# Patient Record
Sex: Female | Born: 1960
Health system: Southern US, Community
[De-identification: ages and names within clinical notes are randomized; demographics above are authoritative.]

## PROBLEM LIST (undated history)

## (undated) DIAGNOSIS — I10 Essential (primary) hypertension: Secondary | ICD-10-CM

## (undated) DIAGNOSIS — N179 Acute kidney failure, unspecified: Secondary | ICD-10-CM

## (undated) HISTORY — DX: Essential (primary) hypertension: I10

---

## 2018-04-22 ENCOUNTER — Telehealth: Payer: Self-pay | Admitting: Hematology and Oncology

## 2018-04-22 NOTE — Telephone Encounter (Signed)
New referral received from Dr. Theda Sers at [facility]. Pt has been scheduled to see Dr. Lindi Adie on 8/6 at 345pm. Pt aware to arrive 30 minutes early. Letter mailed to the.

## 2018-04-29 ENCOUNTER — Telehealth: Payer: Self-pay | Admitting: Hematology and Oncology

## 2018-04-29 ENCOUNTER — Inpatient Hospital Stay: Payer: Managed Care, Other (non HMO) | Attending: Hematology and Oncology | Admitting: Hematology and Oncology

## 2018-04-29 VITALS — BP 141/86 | HR 99 | Temp 98.4°F | Resp 18 | Ht 62.5 in | Wt 121.2 lb

## 2018-04-29 DIAGNOSIS — D7289 Other specified disorders of white blood cells: Secondary | ICD-10-CM | POA: Diagnosis not present

## 2018-04-29 DIAGNOSIS — D72829 Elevated white blood cell count, unspecified: Secondary | ICD-10-CM | POA: Insufficient documentation

## 2018-04-29 DIAGNOSIS — F1020 Alcohol dependence, uncomplicated: Secondary | ICD-10-CM

## 2018-04-29 DIAGNOSIS — D729 Disorder of white blood cells, unspecified: Secondary | ICD-10-CM

## 2018-04-29 DIAGNOSIS — K7010 Alcoholic hepatitis without ascites: Secondary | ICD-10-CM | POA: Diagnosis not present

## 2018-04-29 DIAGNOSIS — D7589 Other specified diseases of blood and blood-forming organs: Secondary | ICD-10-CM | POA: Diagnosis not present

## 2018-04-29 NOTE — Telephone Encounter (Signed)
Appts scheduled AVS/Calendar printed per 8/6 los

## 2018-04-29 NOTE — Progress Notes (Signed)
Patient Care Team: Janie Morning, DO as PCP - General (Family Medicine)  DIAGNOSIS:  Encounter Diagnosis  Name Primary?  . Neutrophilia Yes    CHIEF COMPLIANT: Elevated neutrophil count and elevated GGT  INTERVAL HISTORY: Melissa Kent is a 57 year old with above-mentioned history of elevated WBC count particularly elevated neutrophil count.  She was previously at [facility] well and was never informed that she had elevated white count.  She has had major problems with alcohol abuse and she also complains of pain in the shoulder and the back.  She has problems with sleeping and he was asleep medication.  She takes gabapentin for neuropathic pain in the legs.  REVIEW OF SYSTEMS:   Constitutional: Denies fevers, chills or abnormal weight loss Eyes: Denies blurriness of vision Ears, nose, mouth, throat, and face: Denies mucositis or sore throat Respiratory: Denies cough, dyspnea or wheezes Cardiovascular: Denies palpitation, chest discomfort Gastrointestinal:  Denies nausea, heartburn or change in bowel habits Skin: Denies abnormal skin rashes Lymphatics: Denies new lymphadenopathy or easy bruising Neurological: Peripheral neuropathy Behavioral/Psych: Mood is stable, no new changes  Extremities: No lower extremity edema All other systems were reviewed with the patient and are negative.  I have reviewed the past medical history, past surgical history, social history and family history with the patient and they are unchanged from previous note.  ALLERGIES:  has no allergies on file.  MEDICATIONS:  No current outpatient medications on file.   No current facility-administered medications for this visit.     PHYSICAL EXAMINATION: ECOG PERFORMANCE STATUS: 1 - Symptomatic but completely ambulatory  Vitals:   04/29/18 1543  BP: (!) 141/86  Pulse: 99  Resp: 18  Temp: 98.4 F (36.9 C)  SpO2: 99%   Filed Weights   04/29/18 1543  Weight: 121 lb 3.2 oz (55 kg)    GENERAL:alert,  no distress and comfortable SKIN: skin color, texture, turgor are normal, no rashes or significant lesions EYES: normal, Conjunctiva are pink and non-injected, sclera clear OROPHARYNX:no exudate, no erythema and lips, buccal mucosa, and tongue normal  NECK: supple, thyroid normal size, non-tender, without nodularity LYMPH:  no palpable lymphadenopathy in the cervical, axillary or inguinal LUNGS: clear to auscultation and percussion with normal breathing effort HEART: regular rate & rhythm and no murmurs and no lower extremity edema ABDOMEN:abdomen soft, non-tender and normal bowel sounds MUSCULOSKELETAL:no cyanosis of digits and no clubbing  NEURO: alert & oriented x 3 with fluent speech, peripheral neuropathy EXTREMITIES: No lower extremity edema   LABORATORY DATA:  I have reviewed the data as listed No flowsheet data found.  No results found for: WBC, HGB, HCT, MCV, PLT, NEUTROABS  ASSESSMENT & PLAN:  Neutrophilia Elevation of neutrophils is the primary reason for the elevation of WBC count. I discussed with the patient the differential diagnosis is primarily between infection versus inflammation. Patient has extensive inflammatory areas including the painful clavicle as well as hepatitis from alcoholism.  I also explained to the patient that the elevation of GGT is directly related to alcoholism.  Her macrocytosis with an MCV of 115 is also directly related to alcoholism. If she does not quit drinking then it is very highly likely that it could lead to bone marrow damage and myelodysplasia as well as liver damage and liver cirrhosis.  I also discussed with her about taking supplements including G86, folic acid, thiamine, B6. There is no additional testing that needs to be done from hematology oncology standpoint. We do not need to perform a bone  marrow biopsy at this time.  I would like to see her back in 1 year and if her blood counts improve then we do not have to see her again  in the future.    Orders Placed This Encounter  Procedures  . CBC with Differential (Cancer Center Only)    Standing Status:   Future    Standing Expiration Date:   04/30/2019  . CMP ([facility] only)    Standing Status:   Future    Standing Expiration Date:   04/30/2019   The patient has a good understanding of the overall plan. she agrees with it. she will call with any problems that may develop before the next visit here.   Harriette Ohara, MD 04/29/18

## 2018-04-29 NOTE — Assessment & Plan Note (Signed)
Elevation of neutrophils is the primary reason for the elevation of WBC count. I discussed with the patient the differential diagnosis is primarily between infection versus inflammation. Patient has extensive inflammatory areas including the painful clavicle as well as hepatitis from alcoholism.  I also explained to the patient that the elevation of GGT is directly related to alcoholism.  Her macrocytosis with an MCV of 115 is also directly related to alcoholism. If she does not quit drinking then it is very highly likely that it could lead to bone marrow damage and myelodysplasia as well as liver damage and liver cirrhosis.  I also discussed with her about taking supplements including S13, folic acid, thiamine, B6. There is no additional testing that needs to be done from hematology oncology standpoint. We do not need to perform a bone marrow biopsy at this time.  I would like to see her back in 1 year and if her blood counts improve then we do not have to see her again in the future.

## 2019-04-23 NOTE — Assessment & Plan Note (Deleted)
Neutrophilia: Inflammation related.  Patient has extensive inflammation areas including painful clavicle as well as hepatitis from alcoholism.  Macrocytosis and elevation of GGT secondary to alcoholism as well.  Lab review: 04/30/2019:

## 2019-04-30 ENCOUNTER — Inpatient Hospital Stay: Payer: Self-pay | Attending: Hematology and Oncology

## 2019-04-30 ENCOUNTER — Inpatient Hospital Stay: Payer: Self-pay | Admitting: Hematology and Oncology

## 2020-02-12 DIAGNOSIS — R609 Edema, unspecified: Secondary | ICD-10-CM | POA: Diagnosis not present

## 2020-02-16 DIAGNOSIS — M79671 Pain in right foot: Secondary | ICD-10-CM | POA: Diagnosis not present

## 2020-02-16 DIAGNOSIS — K469 Unspecified abdominal hernia without obstruction or gangrene: Secondary | ICD-10-CM | POA: Diagnosis not present

## 2020-02-16 DIAGNOSIS — R140 Abdominal distension (gaseous): Secondary | ICD-10-CM | POA: Diagnosis not present

## 2020-02-16 DIAGNOSIS — R609 Edema, unspecified: Secondary | ICD-10-CM | POA: Diagnosis not present

## 2020-02-19 DIAGNOSIS — Z79899 Other long term (current) drug therapy: Secondary | ICD-10-CM | POA: Diagnosis not present

## 2020-03-04 ENCOUNTER — Other Ambulatory Visit: Payer: Self-pay | Admitting: Surgery

## 2020-03-04 DIAGNOSIS — K429 Umbilical hernia without obstruction or gangrene: Secondary | ICD-10-CM | POA: Diagnosis not present

## 2020-03-04 DIAGNOSIS — R188 Other ascites: Secondary | ICD-10-CM | POA: Diagnosis not present

## 2020-03-04 DIAGNOSIS — Z720 Tobacco use: Secondary | ICD-10-CM | POA: Diagnosis not present

## 2020-03-07 ENCOUNTER — Other Ambulatory Visit: Payer: Self-pay | Admitting: Surgery

## 2020-03-07 DIAGNOSIS — K429 Umbilical hernia without obstruction or gangrene: Secondary | ICD-10-CM

## 2020-03-19 ENCOUNTER — Other Ambulatory Visit: Payer: Self-pay

## 2020-03-19 ENCOUNTER — Emergency Department (HOSPITAL_COMMUNITY)
Admission: EM | Admit: 2020-03-19 | Discharge: 2020-03-20 | Disposition: A | Discharge status: Home / Self Care | Payer: BC Managed Care – PPO | Attending: Emergency Medicine | Admitting: Emergency Medicine

## 2020-03-19 DIAGNOSIS — R197 Diarrhea, unspecified: Secondary | ICD-10-CM | POA: Diagnosis not present

## 2020-03-19 DIAGNOSIS — R000 Tachycardia, unspecified: Secondary | ICD-10-CM | POA: Diagnosis not present

## 2020-03-19 DIAGNOSIS — R932 Abnormal findings on diagnostic imaging of liver and biliary tract: Secondary | ICD-10-CM | POA: Insufficient documentation

## 2020-03-19 DIAGNOSIS — Z79899 Other long term (current) drug therapy: Secondary | ICD-10-CM | POA: Diagnosis not present

## 2020-03-19 DIAGNOSIS — N3090 Cystitis, unspecified without hematuria: Secondary | ICD-10-CM | POA: Insufficient documentation

## 2020-03-19 DIAGNOSIS — N3000 Acute cystitis without hematuria: Secondary | ICD-10-CM | POA: Diagnosis not present

## 2020-03-19 DIAGNOSIS — F17210 Nicotine dependence, cigarettes, uncomplicated: Secondary | ICD-10-CM | POA: Insufficient documentation

## 2020-03-19 DIAGNOSIS — I10 Essential (primary) hypertension: Secondary | ICD-10-CM | POA: Diagnosis not present

## 2020-03-19 DIAGNOSIS — R160 Hepatomegaly, not elsewhere classified: Secondary | ICD-10-CM

## 2020-03-19 DIAGNOSIS — R1084 Generalized abdominal pain: Secondary | ICD-10-CM | POA: Insufficient documentation

## 2020-03-19 DIAGNOSIS — R112 Nausea with vomiting, unspecified: Secondary | ICD-10-CM | POA: Diagnosis not present

## 2020-03-19 DIAGNOSIS — R1110 Vomiting, unspecified: Secondary | ICD-10-CM | POA: Diagnosis not present

## 2020-03-19 LAB — URINALYSIS, ROUTINE W REFLEX MICROSCOPIC
Bilirubin Urine: NEGATIVE
Glucose, UA: NEGATIVE mg/dL
Ketones, ur: 5 mg/dL — AB
Nitrite: NEGATIVE
Protein, ur: 100 mg/dL — AB
Specific Gravity, Urine: 1.014 (ref 1.005–1.030)
WBC, UA: >50 WBC/hpf — ABNORMAL HIGH (ref 0–5)
pH: 5 (ref 5.0–8.0)

## 2020-03-19 LAB — COMPREHENSIVE METABOLIC PANEL WITH GFR
ALT: 13 U/L (ref 0–44)
AST: 32 U/L (ref 15–41)
Albumin: 2.8 g/dL — ABNORMAL LOW (ref 3.5–5.0)
Alkaline Phosphatase: 189 U/L — ABNORMAL HIGH (ref 38–126)
Anion gap: 18 — ABNORMAL HIGH (ref 5–15)
BUN: 7 mg/dL (ref 6–20)
CO2: 16 mmol/L — ABNORMAL LOW (ref 22–32)
Calcium: 8 mg/dL — ABNORMAL LOW (ref 8.9–10.3)
Chloride: 93 mmol/L — ABNORMAL LOW (ref 98–111)
Creatinine, Ser: 1 mg/dL (ref 0.44–1.00)
GFR calc Af Amer: >60 mL/min
GFR calc non Af Amer: >60 mL/min
Glucose, Bld: 100 mg/dL — ABNORMAL HIGH (ref 70–99)
Potassium: 3.5 mmol/L (ref 3.5–5.1)
Sodium: 127 mmol/L — ABNORMAL LOW (ref 135–145)
Total Bilirubin: 2.5 mg/dL — ABNORMAL HIGH (ref 0.3–1.2)
Total Protein: 7.5 g/dL (ref 6.5–8.1)

## 2020-03-19 LAB — CBC
HCT: 24.1 % — ABNORMAL LOW (ref 36.0–46.0)
Hemoglobin: 8.1 g/dL — ABNORMAL LOW (ref 12.0–15.0)
MCH: 30 pg (ref 26.0–34.0)
MCHC: 33.6 g/dL (ref 30.0–36.0)
MCV: 89.3 fL (ref 80.0–100.0)
Platelets: 550 K/uL — ABNORMAL HIGH (ref 150–400)
RBC: 2.7 MIL/uL — ABNORMAL LOW (ref 3.87–5.11)
RDW: 14.8 % (ref 11.5–15.5)
WBC: 22.5 K/uL — ABNORMAL HIGH (ref 4.0–10.5)
nRBC: 0 % (ref 0.0–0.2)

## 2020-03-19 LAB — LIPASE, BLOOD: Lipase: 44 U/L (ref 11–51)

## 2020-03-19 MED ORDER — SODIUM CHLORIDE 0.9% FLUSH: 3.0000 mL | Freq: Once | INTRAVENOUS | Status: DC

## 2020-03-19 MED ORDER — ONDANSETRON 4 MG PO TBDP
4.0000 mg | ORAL_TABLET | Freq: Once | ORAL | Status: AC | PRN
  Administered 2020-03-19: 4 mg via ORAL
  Filled 2020-03-19: qty 1

## 2020-03-19 NOTE — ED Triage Notes (Signed)
Pt c/o nausea/vomiting/diarrhea x 2 weeks. Also c/o dizziness and feeling "disoriented" at time. A&O x 4 at this time, no neuro deficits noted.

## 2020-03-20 ENCOUNTER — Emergency Department (HOSPITAL_COMMUNITY): Payer: BC Managed Care – PPO

## 2020-03-20 ENCOUNTER — Encounter (HOSPITAL_COMMUNITY): Payer: Self-pay | Admitting: Emergency Medicine

## 2020-03-20 DIAGNOSIS — R1110 Vomiting, unspecified: Secondary | ICD-10-CM | POA: Diagnosis not present

## 2020-03-20 LAB — LACTIC ACID, PLASMA: Lactic Acid, Venous: 0.9 mmol/L (ref 0.5–1.9)

## 2020-03-20 MED ORDER — ONDANSETRON 4 MG PO TBDP
4.0000 mg | ORAL_TABLET | Freq: Three times a day (TID) | ORAL | 0 refills | Status: AC | PRN
Start: 2020-03-20 — End: ?

## 2020-03-20 MED ORDER — OXYCODONE HCL 5 MG PO TABS: 5.0000 mg | ORAL_TABLET | Freq: Four times a day (QID) | ORAL | 0 refills | Status: AC | PRN

## 2020-03-20 MED ORDER — CEPHALEXIN 500 MG PO CAPS
500.0000 mg | ORAL_CAPSULE | Freq: Three times a day (TID) | ORAL | 0 refills | Status: AC
Start: 2020-03-20 — End: ?

## 2020-03-20 MED ORDER — OXYCODONE HCL 5 MG PO CAPS: 5.0000 mg | ORAL_CAPSULE | Freq: Four times a day (QID) | ORAL | 0 refills | Status: DC | PRN

## 2020-03-20 MED ORDER — ONDANSETRON HCL 4 MG/2ML IJ SOLN
4.0000 mg | Freq: Once | INTRAMUSCULAR | Status: AC
  Administered 2020-03-20: 4 mg via INTRAVENOUS
  Filled 2020-03-20: qty 2

## 2020-03-20 MED ORDER — FENTANYL CITRATE (PF) 100 MCG/2ML IJ SOLN
100.0000 ug | Freq: Once | INTRAMUSCULAR | Status: AC
  Administered 2020-03-20: 100 ug via INTRAVENOUS
  Filled 2020-03-20: qty 2

## 2020-03-20 MED ORDER — SODIUM CHLORIDE 0.9 % IV SOLN
1.0000 g | Freq: Once | INTRAVENOUS | Status: AC
  Administered 2020-03-20: 1 g via INTRAVENOUS
  Filled 2020-03-20: qty 10

## 2020-03-20 MED ORDER — HYDROMORPHONE HCL 1 MG/ML IJ SOLN
0.5000 mg | Freq: Once | INTRAMUSCULAR | Status: AC
  Administered 2020-03-20: 0.5 mg via INTRAVENOUS
  Filled 2020-03-20: qty 1

## 2020-03-20 MED ORDER — OXYCODONE HCL 5 MG PO TABS
5.0000 mg | ORAL_TABLET | Freq: Once | ORAL | Status: AC
  Administered 2020-03-20: 5 mg via ORAL
  Filled 2020-03-20: qty 1

## 2020-03-20 MED ORDER — SODIUM CHLORIDE 0.9 % IV BOLUS
1000.0000 mL | Freq: Once | INTRAVENOUS | Status: AC
  Administered 2020-03-20: 1000 mL via INTRAVENOUS

## 2020-03-20 MED ORDER — IOHEXOL 300 MG/ML  SOLN
100.0000 mL | Freq: Once | INTRAMUSCULAR | Status: AC | PRN
  Administered 2020-03-20: 100 mL via INTRAVENOUS

## 2020-03-20 NOTE — ED Notes (Addendum)
Pt ambulated to restroom with slow steady gait. Pt stated she did not feel strong on her feet. 2 person assist provided for safety. Pt returned to bed with assistance and is resting comfortably.

## 2020-03-20 NOTE — Discharge Instructions (Signed)
Please read and follow all provided instructions.  Your diagnoses today include:  1. Nausea vomiting and diarrhea   2. Generalized abdominal pain   3. Liver masses   4. Acute cystitis without hematuria     Tests performed today include:  Blood counts and electrolytes - shows high white blood cell count  Blood tests to check liver and kidney function  Blood tests to check pancreas function  Urine test to look for infection - shows urine infection, a culture was sent  CT of the abdomen and pelvis - you have several masses on your liver that will need to be checked, you will likely need a biopsy for these. Your primary care doctor needs to see you as soon as possible to begin evaluation of these areas and to obtain appropriate follow-up and treatment.   Vital signs. See below for your results today.   Medications prescribed:   Oxycodone - narcotic pain medication  DO NOT drive or perform any activities that require you to be awake and alert because this medicine can make you drowsy.   DO NOT combine this medication with alcohol as it can make you stop breathing.    Zofran (ondansetron) - for nausea and vomiting   Keflex (cephalexin) - antibiotic  You have been prescribed an antibiotic medicine: take the entire course of medicine even if you are feeling better. Stopping early can cause the antibiotic not to work.  Take any prescribed medications only as directed.  Home care instructions:   Follow any educational materials contained in this packet.  Follow-up instructions: Please follow-up with your primary care provider in the next 2 days for further evaluation of your symptoms.    Return instructions:  SEEK IMMEDIATE MEDICAL ATTENTION IF:  The pain does not go away or becomes severe   A temperature above 101F develops   Repeated vomiting occurs (multiple episodes)   The pain becomes localized to portions of the abdomen. The right side could possibly be  appendicitis. In an adult, the left lower portion of the abdomen could be colitis or diverticulitis.   Blood is being passed in stools or vomit (bright red or black tarry stools)   You develop chest pain, difficulty breathing, dizziness or fainting, or become confused, poorly responsive, or inconsolable (young children)  If you have any other emergent concerns regarding your health  Additional Information: Abdominal (belly) pain can be caused by many things. Your caregiver performed an examination and possibly ordered blood/urine tests and imaging (CT scan, x-rays, ultrasound). Many cases can be observed and treated at home after initial evaluation in the emergency department. Even though you are being discharged home, abdominal pain can be unpredictable. Therefore, you need a repeated exam if your pain does not resolve, returns, or worsens. Most patients with abdominal pain don't have to be admitted to the hospital or have surgery, but serious problems like appendicitis and gallbladder attacks can start out as nonspecific pain. Many abdominal conditions cannot be diagnosed in one visit, so follow-up evaluations are very important.  Your vital signs today were: BP 100/60 (BP Location: Right Arm)   Pulse 82   Temp 98.8 F (37.1 C) (Oral)   Resp 16   Ht 5\' 2"  (1.575 m)   Wt 54.4 kg   SpO2 100%   BMI 21.95 kg/m  If your blood pressure (bp) was elevated above 135/85 this visit, please have this repeated by your doctor within one month. --------------

## 2020-03-20 NOTE — ED Notes (Signed)
Provided cracker and water to pt for PO/fluid challenge.

## 2020-03-20 NOTE — ED Provider Notes (Signed)
[facility] EMERGENCY DEPARTMENT Provider Note   CSN: [id number] Arrival date & time: 03/19/20  1804     History Chief Complaint  Patient presents with  . Emesis    Melissa Kent is a 59 y.o. female.  Patient with history of neutrophilia, alcohol abuse, ventral hernia -- presents the emergency department today with 2-week history of nausea, vomiting, and diarrhea.  Patient reports eating and drinking very little due to severe nausea and vomiting.  She vomits after she eats.  She has chronic lower back pain which is at baseline.  She denies hematemesis or blood in the stool.  She states that she is having diarrhea "every 12 seconds".  She denies any overt dysuria, increased frequency or urgency.  She denies fevers or shaking chills.  No headaches.  No treatments prior to arrival.        Past Medical History:  Diagnosis Date  . Hypertension     Patient Active Problem List   Diagnosis Date Noted  . Neutrophilia 04/29/2018    History reviewed. No pertinent surgical history.   OB History   No obstetric history on file.     No family history on file.  Social History   Tobacco Use  . Smoking status: Current Every Day Smoker    Packs/day: 1.00    Years: 20.00    Pack years: 20.00    Types: Cigarettes  Substance Use Topics  . Alcohol use: Yes    Alcohol/week: 35.0 standard drinks    Types: 35 Glasses of wine per week  . Drug use: Never    Home Medications Prior to Admission medications   Medication Sig Start Date End Date Taking? Authorizing Provider  amLODipine-valsartan (EXFORGE) 10-320 MG tablet Take 1 tablet by mouth daily.   Yes [provider]  celecoxib (CELEBREX) 200 MG capsule Take 200 mg by mouth daily.   Yes [provider]  gabapentin (NEURONTIN) 400 MG capsule Take 800 mg by mouth 2 (two) times daily.   Yes [provider]  naproxen sodium (ALEVE) 220 MG tablet Take 220 mg by mouth as needed (pain).   Yes  [provider]  sodium chloride 1 g tablet Take 1 g by mouth daily.   Yes [provider]  zolpidem (AMBIEN) 10 MG tablet Take 10 mg by mouth at bedtime as needed for sleep.   Yes [provider]    Allergies    Patient has no known allergies.  Review of Systems   Review of Systems  Constitutional: Negative for fever.  HENT: Negative for rhinorrhea and sore throat.   Eyes: Negative for redness.  Respiratory: Negative for cough.   Cardiovascular: Negative for chest pain.  Gastrointestinal: Positive for abdominal pain, diarrhea, nausea and vomiting. Negative for blood in stool.  Genitourinary: Negative for dysuria, flank pain, frequency, hematuria and urgency.  Musculoskeletal: Negative for myalgias.  Skin: Negative for rash.  Neurological: Negative for headaches.    Physical Exam Updated Vital Signs BP 96/61 (BP Location: Left Arm)   Pulse 98   Temp 98.8 F (37.1 C) (Oral)   Resp 20   Ht 5\' 2"  (1.575 m)   Wt 54.4 kg   SpO2 100%   BMI 21.95 kg/m   Physical Exam Vitals and nursing note reviewed.  Constitutional:      Appearance: She is well-developed.     Comments: thin  HENT:     Head: Normocephalic and atraumatic.     Mouth/Throat:  Comments: Poor dentition Eyes:     General:        Right eye: No discharge.        Left eye: No discharge.     Conjunctiva/sclera: Conjunctivae normal.  Cardiovascular:     Rate and Rhythm: Normal rate and regular rhythm.     Heart sounds: Normal heart sounds.  Pulmonary:     Effort: Pulmonary effort is normal.     Breath sounds: Normal breath sounds.  Abdominal:     Palpations: Abdomen is soft.     Tenderness: There is abdominal tenderness in the suprapubic area. There is no right CVA tenderness, left CVA tenderness, guarding or rebound.  Musculoskeletal:     Cervical back: Normal range of motion and neck supple.     Right lower leg: No edema.     Left lower leg: No edema.  Skin:    General:  Skin is warm and dry.  Neurological:     General: No focal deficit present.     Mental Status: She is alert and oriented to person, place, and time.     ED Results / Procedures / Treatments   Labs (all labs ordered are listed, but only abnormal results are displayed) Labs Reviewed  COMPREHENSIVE METABOLIC PANEL - Abnormal; Notable for the following components:      Result Value   Sodium 127 (*)    Chloride 93 (*)    CO2 16 (*)    Glucose, Bld 100 (*)    Calcium 8.0 (*)    Albumin 2.8 (*)    Alkaline Phosphatase 189 (*)    Total Bilirubin 2.5 (*)    Anion gap 18 (*)    All other components within normal limits  CBC - Abnormal; Notable for the following components:   WBC 22.5 (*)    RBC 2.70 (*)    Hemoglobin 8.1 (*)    HCT 24.1 (*)    Platelets 550 (*)    All other components within normal limits  URINALYSIS, ROUTINE W REFLEX MICROSCOPIC - Abnormal; Notable for the following components:   Color, Urine AMBER (*)    APPearance CLOUDY (*)    Hgb urine dipstick SMALL (*)    Ketones, ur 5 (*)    Protein, ur 100 (*)    Leukocytes,Ua MODERATE (*)    WBC, UA >50 (*)    Bacteria, UA MANY (*)    Non Squamous Epithelial 0-5 (*)    All other components within normal limits  URINE CULTURE  CULTURE, BLOOD (ROUTINE X 2)  CULTURE, BLOOD (ROUTINE X 2)  LIPASE, BLOOD  LACTIC ACID, PLASMA    EKG EKG Interpretation  Date/Time:  Saturday March 19 2020 18:22:06 EDT Ventricular Rate:  123 PR Interval:  152 QRS Duration: 56 QT Interval:  302 QTC Calculation: 432 R Axis:   61 Text Interpretation: Sinus tachycardia with Premature supraventricular complexes and Fusion complexes Low voltage QRS Cannot rule out Anterior infarct , age undetermined No previous tracing Confirmed by Blanchie Dessert [phone]) on 03/20/2020 10:37:34 AM   Radiology CT ABDOMEN PELVIS W CONTRAST  Result Date: 03/20/2020 CLINICAL DATA:  Nausea, vomiting and diarrhea for 2 weeks. EXAM: CT ABDOMEN AND PELVIS WITH  CONTRAST TECHNIQUE: Multidetector CT imaging of the abdomen and pelvis was performed using the standard protocol following bolus administration of intravenous contrast. CONTRAST:  183mL OMNIPAQUE IOHEXOL 300 MG/ML  SOLN COMPARISON:  None. FINDINGS: Lower chest: Mild linear opacities at the right lung base consistent with atelectasis.  No acute findings. No lung base masses or nodules. Hepatobiliary: Poorly defined hypoattenuating liver masses involving most of the liver. Throughout most of the anterior right lobe, and involving the medial segment of the left lobe, a hypoattenuating mass spans approximately 17 x 6 x 10 cm. Another poorly defined mass involves most of the posterior aspect of the right liver lobe measuring approximately 16 x 6 x 9 cm. There is a mass centered on the caudate lobe measuring approximately 8 x 5 x 6 cm. The lateral segment of the left lobe a small heterogeneous. Gallbladder is distended. There is gallbladder sludge and calcified stones. No wall thickening or evidence of acute cholecystitis. No bile duct dilation. Pancreas: Unremarkable. No pancreatic ductal dilatation or surrounding inflammatory changes. Spleen: Normal in size without focal abnormality. Adrenals/Urinary Tract: No adrenal masses. Right kidney mildly displaced inferiorly by the liver. Kidneys normal in overall size. Mild diffuse renal cortical thinning. Symmetric renal enhancement and excretion. No renal masses, stones or hydronephrosis. Ureters normal in course and in caliber. Bladder is unremarkable. Stomach/Bowel: Stomach is unremarkable. Small bowel and colon are normal in caliber. No wall thickening. No convincing inflammation. Possible small polyp, 7 mm in size, projects within the left colon at the splenic flexure. Appendix not definitively seen, but no convincing appendicitis. Vascular/Lymphatic: Aortic atherosclerosis. No aneurysm. No enlarged lymph nodes. Reproductive: Uterus and bilateral adnexa are unremarkable.  Other: Moderate amount of ascites. Small amount of fluid enters a small anterior midline hernia. Musculoskeletal: Mild compression fracture of T9, which appears benign and most likely chronic. No other fractures. No osteoblastic or osteolytic lesions. IMPRESSION: 1. Multiple ill-defined hypoattenuating liver masses involving most of the liver. This is highly suspicious for neoplastic disease. Suspect multifocal hepatocellular carcinoma, with metastatic disease also possible. There are no other findings on the CT to suggest a source of metastatic disease. There is a possible 7 mm polyp in the splenic flexure of the colon, which would not account for the liver findings. Consider follow-up liver MRI without and with contrast for further assessment and characterization. 2. Moderate ascites.  No convincing peritoneal carcinomatosis. 3. Gallstones and gallbladder sludge.  No acute cholecystitis. 4. No CT evidence of pyelonephritis.  No hydronephrosis. 5. Aortic atherosclerosis. Electronically Signed   By: Lajean Manes M.D.   On: 03/20/2020 09:38    Procedures Procedures (including critical care time)  Medications Ordered in ED Medications  sodium chloride flush (NS) 0.9 % injection 3 mL (has no administration in time range)  ondansetron (ZOFRAN-ODT) disintegrating tablet 4 mg (4 mg Oral Given 03/19/20 1829)  sodium chloride 0.9 % bolus 1,000 mL (0 mLs Intravenous Stopped 03/20/20 1007)  ondansetron (ZOFRAN) injection 4 mg (4 mg Intravenous Given 03/20/20 0810)  HYDROmorphone (DILAUDID) injection 0.5 mg (0.5 mg Intravenous Given 03/20/20 0811)  iohexol (OMNIPAQUE) 300 MG/ML solution 100 mL (100 mLs Intravenous Contrast Given 03/20/20 0922)  cefTRIAXone (ROCEPHIN) 1 g in sodium chloride 0.9 % 100 mL IVPB (1 g Intravenous New Bag/Given 03/20/20 1021)  fentaNYL (SUBLIMAZE) injection 100 mcg (100 mcg Intravenous Given 03/20/20 1032)  oxyCODONE (Oxy IR/ROXICODONE) immediate release tablet 5 mg (5 mg Oral Given 03/20/20  1032)    ED Course  I have reviewed the triage vital signs and the nursing notes.  Pertinent labs & imaging results that were available during my care of the patient were reviewed by me and considered in my medical decision making (see chart for details).  Patient seen and examined.  Patient seen after 13-hour wait in  the waiting room.  Labs to this point show elevated white blood cell count, unclear if this is acute or chronic.  Patient has a hematology/oncology note from 2019 which states evaluation for neutropenia.  I cannot see any previous labs to demonstrate a baseline this was not clear in the notes.  However patient does have obvious urinary tract infection on UA.  Persistent nausea and vomiting is concerning for pyelonephritis, however with duration of symptoms and no fever, would like to evaluate for other causes of persistent nausea and vomiting as well.  Patient has a nonperitoneal abdominal exam.  She is tender over the suprapubic area.  No CVA tenderness exhibited.  Medications, fluid ordered. Added lactate, blood cultures, CT abd/pelvis.   Vital signs reviewed and are as follows: BP 96/61 (BP Location: Left Arm)   Pulse 98   Temp 98.8 F (37.1 C) (Oral)   Resp 20   Ht 5\' 2"  (1.575 m)   Wt 54.4 kg   SpO2 100%   BMI 21.95 kg/m   11:57 AM CT was reviewed earlier.  Patient with moderate ascites and liver masses.  Does not have a previous diagnosis of this.  I updated the patient at bedside and explained results.  Her urine is infected and culture sent.  She will be given a dose of Rocephin.  Her pain is controlled in the ED with treatment.  Plan discharged home with Zofran, oxycodone, cephalexin for UTI.  We discussed that she will need to follow-up with her doctor this week for recheck of her symptoms as well as to initiate evaluation of these liver masses.  Discussed with patient that she may need a biopsy to determine what these masses are.  She verbalizes understanding agrees  with plan.  Patient counseled on use of narcotic pain medications. Counseled not to combine these medications with others containing tylenol. Urged not to drink alcohol, drive, or perform any other activities that requires focus while taking these medications. The patient verbalizes understanding and agrees with the plan.  The patient was urged to return to the Emergency Department immediately with worsening of current symptoms, worsening abdominal pain, persistent vomiting, blood noted in stools, fever, or any other concerns. The patient verbalized understanding.     MDM Rules/Calculators/A&P                          Patient with nausea, vomiting, diarrhea.  She has an elevated white blood cell count today, possibly chronic.  She was evaluated CT imaging which shows liver masses, concern for hepatocellular carcinoma, as well as moderate ascites.  She has a UTI.  She was treated in the ED with Rocephin and fluids, pain control with improvement.  She is willing to follow-up with her doctor.  Home with symptomatic care and antibiotic for UTI.  Return instructions as above.    Final Clinical Impression(s) / ED Diagnoses Final diagnoses:  Nausea vomiting and diarrhea  Generalized abdominal pain  Liver masses  Acute cystitis without hematuria    Rx / DC Orders ED Discharge Orders         Ordered    ondansetron (ZOFRAN ODT) 4 MG disintegrating tablet  Every 8 hours PRN     Discontinue  Reprint     03/20/20 1151    oxycodone (OXY-IR) 5 MG capsule  Every 6 hours PRN     Discontinue  Reprint     03/20/20 1151    cephALEXin (KEFLEX) 500 MG  capsule  3 times daily     Discontinue  Reprint     03/20/20 1151           Carlisle Cater, Hershal Coria 03/20/20 1201    Blanchie Dessert, MD 03/20/20 [phone]

## 2020-03-22 LAB — URINE CULTURE: Culture: >=100000 — AB

## 2020-03-23 ENCOUNTER — Inpatient Hospital Stay: Admission: RE | Admit: 2020-03-23 | Payer: Self-pay | Source: Ambulatory Visit

## 2020-03-25 LAB — CULTURE, BLOOD (ROUTINE X 2)
Culture: NO GROWTH
Culture: NO GROWTH

## 2020-04-11 DIAGNOSIS — K769 Liver disease, unspecified: Secondary | ICD-10-CM | POA: Diagnosis not present

## 2020-04-11 DIAGNOSIS — R188 Other ascites: Secondary | ICD-10-CM | POA: Diagnosis not present

## 2020-04-11 DIAGNOSIS — R112 Nausea with vomiting, unspecified: Secondary | ICD-10-CM | POA: Diagnosis not present

## 2020-04-11 DIAGNOSIS — R630 Anorexia: Secondary | ICD-10-CM | POA: Diagnosis not present

## 2020-04-19 ENCOUNTER — Other Ambulatory Visit: Payer: Self-pay | Admitting: Family Medicine

## 2020-04-19 DIAGNOSIS — R188 Other ascites: Secondary | ICD-10-CM

## 2020-04-19 DIAGNOSIS — R112 Nausea with vomiting, unspecified: Secondary | ICD-10-CM

## 2020-04-19 DIAGNOSIS — R630 Anorexia: Secondary | ICD-10-CM

## 2020-04-19 DIAGNOSIS — K769 Liver disease, unspecified: Secondary | ICD-10-CM

## 2020-04-23 ENCOUNTER — Emergency Department (HOSPITAL_COMMUNITY): Payer: BC Managed Care – PPO

## 2020-04-23 ENCOUNTER — Inpatient Hospital Stay (HOSPITAL_COMMUNITY): Payer: BC Managed Care – PPO

## 2020-04-23 ENCOUNTER — Inpatient Hospital Stay (HOSPITAL_COMMUNITY): Payer: BC Managed Care – PPO | Admitting: Certified Registered"

## 2020-04-23 ENCOUNTER — Other Ambulatory Visit: Payer: Self-pay

## 2020-04-23 ENCOUNTER — Encounter (HOSPITAL_COMMUNITY): Admission: EM | Disposition: E | Payer: Self-pay | Source: Home / Self Care | Attending: Pulmonary Disease

## 2020-04-23 ENCOUNTER — Encounter (HOSPITAL_COMMUNITY): Payer: Self-pay | Admitting: Emergency Medicine

## 2020-04-23 ENCOUNTER — Inpatient Hospital Stay (HOSPITAL_COMMUNITY)
Admission: EM | Admit: 2020-04-23 | Discharge: 2020-05-25 | DRG: 368 | Disposition: E | Payer: BC Managed Care – PPO | Attending: Pulmonary Disease | Admitting: Pulmonary Disease

## 2020-04-23 DIAGNOSIS — Z66 Do not resuscitate: Secondary | ICD-10-CM | POA: Diagnosis not present

## 2020-04-23 DIAGNOSIS — D500 Iron deficiency anemia secondary to blood loss (chronic): Secondary | ICD-10-CM | POA: Diagnosis not present

## 2020-04-23 DIAGNOSIS — R160 Hepatomegaly, not elsewhere classified: Secondary | ICD-10-CM | POA: Diagnosis not present

## 2020-04-23 DIAGNOSIS — I4891 Unspecified atrial fibrillation: Secondary | ICD-10-CM | POA: Diagnosis not present

## 2020-04-23 DIAGNOSIS — K922 Gastrointestinal hemorrhage, unspecified: Secondary | ICD-10-CM | POA: Diagnosis present

## 2020-04-23 DIAGNOSIS — K7290 Hepatic failure, unspecified without coma: Secondary | ICD-10-CM | POA: Diagnosis not present

## 2020-04-23 DIAGNOSIS — R64 Cachexia: Secondary | ICD-10-CM | POA: Diagnosis not present

## 2020-04-23 DIAGNOSIS — J9601 Acute respiratory failure with hypoxia: Secondary | ICD-10-CM | POA: Diagnosis not present

## 2020-04-23 DIAGNOSIS — Z4682 Encounter for fitting and adjustment of non-vascular catheter: Secondary | ICD-10-CM | POA: Diagnosis not present

## 2020-04-23 DIAGNOSIS — K2101 Gastro-esophageal reflux disease with esophagitis, with bleeding: Secondary | ICD-10-CM | POA: Diagnosis not present

## 2020-04-23 DIAGNOSIS — J189 Pneumonia, unspecified organism: Secondary | ICD-10-CM

## 2020-04-23 DIAGNOSIS — K2971 Gastritis, unspecified, with bleeding: Secondary | ICD-10-CM | POA: Diagnosis present

## 2020-04-23 DIAGNOSIS — N179 Acute kidney failure, unspecified: Secondary | ICD-10-CM

## 2020-04-23 DIAGNOSIS — K7581 Nonalcoholic steatohepatitis (NASH): Secondary | ICD-10-CM | POA: Diagnosis not present

## 2020-04-23 DIAGNOSIS — E871 Hypo-osmolality and hyponatremia: Secondary | ICD-10-CM | POA: Diagnosis present

## 2020-04-23 DIAGNOSIS — E43 Unspecified severe protein-calorie malnutrition: Secondary | ICD-10-CM | POA: Diagnosis not present

## 2020-04-23 DIAGNOSIS — T17908A Unspecified foreign body in respiratory tract, part unspecified causing other injury, initial encounter: Secondary | ICD-10-CM

## 2020-04-23 DIAGNOSIS — R739 Hyperglycemia, unspecified: Secondary | ICD-10-CM | POA: Diagnosis not present

## 2020-04-23 DIAGNOSIS — D62 Acute posthemorrhagic anemia: Secondary | ICD-10-CM | POA: Diagnosis present

## 2020-04-23 DIAGNOSIS — I083 Combined rheumatic disorders of mitral, aortic and tricuspid valves: Secondary | ICD-10-CM | POA: Diagnosis present

## 2020-04-23 DIAGNOSIS — J181 Lobar pneumonia, unspecified organism: Secondary | ICD-10-CM | POA: Diagnosis not present

## 2020-04-23 DIAGNOSIS — R918 Other nonspecific abnormal finding of lung field: Secondary | ICD-10-CM | POA: Diagnosis not present

## 2020-04-23 DIAGNOSIS — T17908D Unspecified foreign body in respiratory tract, part unspecified causing other injury, subsequent encounter: Secondary | ICD-10-CM | POA: Diagnosis not present

## 2020-04-23 DIAGNOSIS — A4189 Other specified sepsis: Secondary | ICD-10-CM | POA: Diagnosis not present

## 2020-04-23 DIAGNOSIS — Z993 Dependence on wheelchair: Secondary | ICD-10-CM

## 2020-04-23 DIAGNOSIS — Z87891 Personal history of nicotine dependence: Secondary | ICD-10-CM

## 2020-04-23 DIAGNOSIS — J811 Chronic pulmonary edema: Secondary | ICD-10-CM | POA: Diagnosis not present

## 2020-04-23 DIAGNOSIS — K8080 Other cholelithiasis without obstruction: Secondary | ICD-10-CM | POA: Diagnosis not present

## 2020-04-23 DIAGNOSIS — R188 Other ascites: Secondary | ICD-10-CM | POA: Diagnosis not present

## 2020-04-23 DIAGNOSIS — J95821 Acute postprocedural respiratory failure: Secondary | ICD-10-CM | POA: Diagnosis not present

## 2020-04-23 DIAGNOSIS — I471 Supraventricular tachycardia: Secondary | ICD-10-CM | POA: Diagnosis not present

## 2020-04-23 DIAGNOSIS — A419 Sepsis, unspecified organism: Secondary | ICD-10-CM

## 2020-04-23 DIAGNOSIS — D376 Neoplasm of uncertain behavior of liver, gallbladder and bile ducts: Secondary | ICD-10-CM | POA: Diagnosis present

## 2020-04-23 DIAGNOSIS — I351 Nonrheumatic aortic (valve) insufficiency: Secondary | ICD-10-CM | POA: Diagnosis not present

## 2020-04-23 DIAGNOSIS — I361 Nonrheumatic tricuspid (valve) insufficiency: Secondary | ICD-10-CM | POA: Diagnosis not present

## 2020-04-23 DIAGNOSIS — Z4659 Encounter for fitting and adjustment of other gastrointestinal appliance and device: Secondary | ICD-10-CM

## 2020-04-23 DIAGNOSIS — E870 Hyperosmolality and hypernatremia: Secondary | ICD-10-CM | POA: Diagnosis not present

## 2020-04-23 DIAGNOSIS — G9341 Metabolic encephalopathy: Secondary | ICD-10-CM | POA: Diagnosis not present

## 2020-04-23 DIAGNOSIS — B9620 Unspecified Escherichia coli [E. coli] as the cause of diseases classified elsewhere: Secondary | ICD-10-CM | POA: Diagnosis not present

## 2020-04-23 DIAGNOSIS — D72829 Elevated white blood cell count, unspecified: Secondary | ICD-10-CM | POA: Diagnosis not present

## 2020-04-23 DIAGNOSIS — R197 Diarrhea, unspecified: Secondary | ICD-10-CM | POA: Diagnosis not present

## 2020-04-23 DIAGNOSIS — Z452 Encounter for adjustment and management of vascular access device: Secondary | ICD-10-CM | POA: Diagnosis not present

## 2020-04-23 DIAGNOSIS — E872 Acidosis: Secondary | ICD-10-CM | POA: Diagnosis present

## 2020-04-23 DIAGNOSIS — Z789 Other specified health status: Secondary | ICD-10-CM | POA: Diagnosis not present

## 2020-04-23 DIAGNOSIS — Z9889 Other specified postprocedural states: Secondary | ICD-10-CM

## 2020-04-23 DIAGNOSIS — E876 Hypokalemia: Secondary | ICD-10-CM | POA: Diagnosis present

## 2020-04-23 DIAGNOSIS — R5381 Other malaise: Secondary | ICD-10-CM | POA: Diagnosis not present

## 2020-04-23 DIAGNOSIS — R54 Age-related physical debility: Secondary | ICD-10-CM | POA: Diagnosis present

## 2020-04-23 DIAGNOSIS — Z515 Encounter for palliative care: Secondary | ICD-10-CM | POA: Diagnosis not present

## 2020-04-23 DIAGNOSIS — I509 Heart failure, unspecified: Secondary | ICD-10-CM | POA: Diagnosis not present

## 2020-04-23 DIAGNOSIS — Z7189 Other specified counseling: Secondary | ICD-10-CM

## 2020-04-23 DIAGNOSIS — R6521 Severe sepsis with septic shock: Secondary | ICD-10-CM | POA: Diagnosis not present

## 2020-04-23 DIAGNOSIS — R627 Adult failure to thrive: Secondary | ICD-10-CM | POA: Diagnosis present

## 2020-04-23 DIAGNOSIS — K7031 Alcoholic cirrhosis of liver with ascites: Secondary | ICD-10-CM | POA: Diagnosis present

## 2020-04-23 DIAGNOSIS — J690 Pneumonitis due to inhalation of food and vomit: Secondary | ICD-10-CM

## 2020-04-23 DIAGNOSIS — B9689 Other specified bacterial agents as the cause of diseases classified elsewhere: Secondary | ICD-10-CM | POA: Diagnosis present

## 2020-04-23 DIAGNOSIS — K2211 Ulcer of esophagus with bleeding: Secondary | ICD-10-CM | POA: Diagnosis not present

## 2020-04-23 DIAGNOSIS — I110 Hypertensive heart disease with heart failure: Secondary | ICD-10-CM | POA: Diagnosis present

## 2020-04-23 DIAGNOSIS — L8990 Pressure ulcer of unspecified site, unspecified stage: Secondary | ICD-10-CM | POA: Insufficient documentation

## 2020-04-23 DIAGNOSIS — K449 Diaphragmatic hernia without obstruction or gangrene: Secondary | ICD-10-CM | POA: Diagnosis not present

## 2020-04-23 DIAGNOSIS — F1010 Alcohol abuse, uncomplicated: Secondary | ICD-10-CM | POA: Diagnosis present

## 2020-04-23 DIAGNOSIS — I340 Nonrheumatic mitral (valve) insufficiency: Secondary | ICD-10-CM | POA: Diagnosis not present

## 2020-04-23 DIAGNOSIS — J9811 Atelectasis: Secondary | ICD-10-CM | POA: Diagnosis not present

## 2020-04-23 DIAGNOSIS — K2970 Gastritis, unspecified, without bleeding: Secondary | ICD-10-CM | POA: Diagnosis not present

## 2020-04-23 DIAGNOSIS — R34 Anuria and oliguria: Secondary | ICD-10-CM | POA: Diagnosis not present

## 2020-04-23 DIAGNOSIS — R000 Tachycardia, unspecified: Secondary | ICD-10-CM | POA: Diagnosis not present

## 2020-04-23 DIAGNOSIS — K920 Hematemesis: Secondary | ICD-10-CM | POA: Diagnosis not present

## 2020-04-23 DIAGNOSIS — I213 ST elevation (STEMI) myocardial infarction of unspecified site: Secondary | ICD-10-CM | POA: Diagnosis not present

## 2020-04-23 DIAGNOSIS — J9690 Respiratory failure, unspecified, unspecified whether with hypoxia or hypercapnia: Secondary | ICD-10-CM

## 2020-04-23 DIAGNOSIS — R601 Generalized edema: Secondary | ICD-10-CM | POA: Diagnosis not present

## 2020-04-23 DIAGNOSIS — R7989 Other specified abnormal findings of blood chemistry: Secondary | ICD-10-CM

## 2020-04-23 DIAGNOSIS — L89311 Pressure ulcer of right buttock, stage 1: Secondary | ICD-10-CM | POA: Diagnosis not present

## 2020-04-23 DIAGNOSIS — I5033 Acute on chronic diastolic (congestive) heart failure: Secondary | ICD-10-CM | POA: Diagnosis not present

## 2020-04-23 DIAGNOSIS — Z87898 Personal history of other specified conditions: Secondary | ICD-10-CM

## 2020-04-23 DIAGNOSIS — Z20822 Contact with and (suspected) exposure to covid-19: Secondary | ICD-10-CM | POA: Diagnosis not present

## 2020-04-23 DIAGNOSIS — R0902 Hypoxemia: Secondary | ICD-10-CM | POA: Diagnosis not present

## 2020-04-23 DIAGNOSIS — Z6826 Body mass index (BMI) 26.0-26.9, adult: Secondary | ICD-10-CM

## 2020-04-23 DIAGNOSIS — R062 Wheezing: Secondary | ICD-10-CM | POA: Diagnosis not present

## 2020-04-23 DIAGNOSIS — K7689 Other specified diseases of liver: Secondary | ICD-10-CM | POA: Diagnosis not present

## 2020-04-23 DIAGNOSIS — K2100 Gastro-esophageal reflux disease with esophagitis, without bleeding: Secondary | ICD-10-CM | POA: Diagnosis not present

## 2020-04-23 DIAGNOSIS — E860 Dehydration: Secondary | ICD-10-CM | POA: Diagnosis not present

## 2020-04-23 DIAGNOSIS — Z978 Presence of other specified devices: Secondary | ICD-10-CM

## 2020-04-23 DIAGNOSIS — K3189 Other diseases of stomach and duodenum: Secondary | ICD-10-CM | POA: Diagnosis not present

## 2020-04-23 DIAGNOSIS — J90 Pleural effusion, not elsewhere classified: Secondary | ICD-10-CM | POA: Diagnosis not present

## 2020-04-23 DIAGNOSIS — I10 Essential (primary) hypertension: Secondary | ICD-10-CM | POA: Diagnosis not present

## 2020-04-23 DIAGNOSIS — E878 Other disorders of electrolyte and fluid balance, not elsewhere classified: Secondary | ICD-10-CM | POA: Diagnosis not present

## 2020-04-23 DIAGNOSIS — R1310 Dysphagia, unspecified: Secondary | ICD-10-CM | POA: Diagnosis present

## 2020-04-23 DIAGNOSIS — J9600 Acute respiratory failure, unspecified whether with hypoxia or hypercapnia: Secondary | ICD-10-CM

## 2020-04-23 HISTORY — DX: Acute kidney failure, unspecified: N17.9

## 2020-04-23 HISTORY — PX: ESOPHAGOGASTRODUODENOSCOPY (EGD) WITH PROPOFOL: SHX5813

## 2020-04-23 LAB — COMPREHENSIVE METABOLIC PANEL
ALT: 11 U/L (ref 0–44)
AST: 30 U/L (ref 15–41)
Albumin: 2.5 g/dL — ABNORMAL LOW (ref 3.5–5.0)
Alkaline Phosphatase: 137 U/L — ABNORMAL HIGH (ref 38–126)
Anion gap: 21 — ABNORMAL HIGH (ref 5–15)
BUN: 25 mg/dL — ABNORMAL HIGH (ref 6–20)
CO2: 16 mmol/L — ABNORMAL LOW (ref 22–32)
Calcium: 7.1 mg/dL — ABNORMAL LOW (ref 8.9–10.3)
Chloride: 86 mmol/L — ABNORMAL LOW (ref 98–111)
Creatinine, Ser: 1.78 mg/dL — ABNORMAL HIGH (ref 0.44–1.00)
GFR calc Af Amer: 35 mL/min — ABNORMAL LOW (ref >60)
GFR calc non Af Amer: 30 mL/min — ABNORMAL LOW (ref >60)
Glucose, Bld: 102 mg/dL — ABNORMAL HIGH (ref 70–99)
Potassium: 3.1 mmol/L — ABNORMAL LOW (ref 3.5–5.1)
Sodium: 123 mmol/L — ABNORMAL LOW (ref 135–145)
Total Bilirubin: 3.1 mg/dL — ABNORMAL HIGH (ref 0.3–1.2)
Total Protein: 6.8 g/dL (ref 6.5–8.1)

## 2020-04-23 LAB — PROTEIN, PLEURAL OR PERITONEAL FLUID: Total protein, fluid: <3 g/dL

## 2020-04-23 LAB — CBC WITH DIFFERENTIAL/PLATELET
Abs Immature Granulocytes: 0.15 10*3/uL — ABNORMAL HIGH (ref 0.00–0.07)
Abs Immature Granulocytes: 0.03 10*3/uL (ref 0.00–0.07)
Basophils Absolute: 0 10*3/uL (ref 0.0–0.1)
Basophils Absolute: 0 10*3/uL (ref 0.0–0.1)
Basophils Relative: 0 %
Basophils Relative: 0 %
Eosinophils Absolute: 0 10*3/uL (ref 0.0–0.5)
Eosinophils Absolute: 0 10*3/uL (ref 0.0–0.5)
Eosinophils Relative: 0 %
Eosinophils Relative: 0 %
HCT: 25 % — ABNORMAL LOW (ref 36.0–46.0)
HCT: 22 % — ABNORMAL LOW (ref 36.0–46.0)
Hemoglobin: 8 g/dL — ABNORMAL LOW (ref 12.0–15.0)
Hemoglobin: 7.4 g/dL — ABNORMAL LOW (ref 12.0–15.0)
Immature Granulocytes: 1 %
Immature Granulocytes: 0 %
Lymphocytes Relative: 5 %
Lymphocytes Relative: 6 %
Lymphs Abs: 1 10*3/uL (ref 0.7–4.0)
Lymphs Abs: 0.9 10*3/uL (ref 0.7–4.0)
MCH: 28.5 pg (ref 26.0–34.0)
MCH: 29.2 pg (ref 26.0–34.0)
MCHC: 32 g/dL (ref 30.0–36.0)
MCHC: 33.6 g/dL (ref 30.0–36.0)
MCV: 89 fL (ref 80.0–100.0)
MCV: 87 fL (ref 80.0–100.0)
Monocytes Absolute: 1.1 10*3/uL — ABNORMAL HIGH (ref 0.1–1.0)
Monocytes Absolute: 0.1 10*3/uL (ref 0.1–1.0)
Monocytes Relative: 5 %
Monocytes Relative: 1 %
Neutro Abs: 18.6 10*3/uL — ABNORMAL HIGH (ref 1.7–7.7)
Neutro Abs: 15.1 10*3/uL — ABNORMAL HIGH (ref 1.7–7.7)
Neutrophils Relative %: 89 %
Neutrophils Relative %: 93 %
Platelets: 512 10*3/uL — ABNORMAL HIGH (ref 150–400)
Platelets: 406 10*3/uL — ABNORMAL HIGH (ref 150–400)
RBC: 2.81 MIL/uL — ABNORMAL LOW (ref 3.87–5.11)
RBC: 2.53 MIL/uL — ABNORMAL LOW (ref 3.87–5.11)
RDW: 17 % — ABNORMAL HIGH (ref 11.5–15.5)
RDW: 16.3 % — ABNORMAL HIGH (ref 11.5–15.5)
WBC: 20.8 10*3/uL — ABNORMAL HIGH (ref 4.0–10.5)
WBC: 16.2 10*3/uL — ABNORMAL HIGH (ref 4.0–10.5)
nRBC: 0 % (ref 0.0–0.2)
nRBC: 0 % (ref 0.0–0.2)

## 2020-04-23 LAB — BASIC METABOLIC PANEL
Anion gap: >20 — ABNORMAL HIGH (ref 5–15)
Anion gap: 17 — ABNORMAL HIGH (ref 5–15)
Anion gap: 17 — ABNORMAL HIGH (ref 5–15)
BUN: 25 mg/dL — ABNORMAL HIGH (ref 6–20)
BUN: 26 mg/dL — ABNORMAL HIGH (ref 6–20)
BUN: 25 mg/dL — ABNORMAL HIGH (ref 6–20)
CO2: 13 mmol/L — ABNORMAL LOW (ref 22–32)
CO2: 16 mmol/L — ABNORMAL LOW (ref 22–32)
CO2: 15 mmol/L — ABNORMAL LOW (ref 22–32)
Calcium: 6.7 mg/dL — ABNORMAL LOW (ref 8.9–10.3)
Calcium: 6.7 mg/dL — ABNORMAL LOW (ref 8.9–10.3)
Calcium: 6.4 mg/dL — CL (ref 8.9–10.3)
Chloride: 88 mmol/L — ABNORMAL LOW (ref 98–111)
Chloride: 90 mmol/L — ABNORMAL LOW (ref 98–111)
Chloride: 97 mmol/L — ABNORMAL LOW (ref 98–111)
Creatinine, Ser: 1.82 mg/dL — ABNORMAL HIGH (ref 0.44–1.00)
Creatinine, Ser: 1.96 mg/dL — ABNORMAL HIGH (ref 0.44–1.00)
Creatinine, Ser: 1.54 mg/dL — ABNORMAL HIGH (ref 0.44–1.00)
GFR calc Af Amer: 34 mL/min — ABNORMAL LOW (ref >60)
GFR calc Af Amer: 31 mL/min — ABNORMAL LOW (ref >60)
GFR calc Af Amer: 42 mL/min — ABNORMAL LOW (ref >60)
GFR calc non Af Amer: 30 mL/min — ABNORMAL LOW (ref >60)
GFR calc non Af Amer: 27 mL/min — ABNORMAL LOW (ref >60)
GFR calc non Af Amer: 36 mL/min — ABNORMAL LOW (ref >60)
Glucose, Bld: 109 mg/dL — ABNORMAL HIGH (ref 70–99)
Glucose, Bld: 108 mg/dL — ABNORMAL HIGH (ref 70–99)
Glucose, Bld: 89 mg/dL (ref 70–99)
Potassium: 3.3 mmol/L — ABNORMAL LOW (ref 3.5–5.1)
Potassium: 4.5 mmol/L (ref 3.5–5.1)
Potassium: 3.2 mmol/L — ABNORMAL LOW (ref 3.5–5.1)
Sodium: 122 mmol/L — ABNORMAL LOW (ref 135–145)
Sodium: 123 mmol/L — ABNORMAL LOW (ref 135–145)
Sodium: 129 mmol/L — ABNORMAL LOW (ref 135–145)

## 2020-04-23 LAB — BLOOD GAS, ARTERIAL
Acid-base deficit: 11.9 mmol/L — ABNORMAL HIGH (ref 0.0–2.0)
Acid-base deficit: 11.7 mmol/L — ABNORMAL HIGH (ref 0.0–2.0)
Bicarbonate: 13.8 mmol/L — ABNORMAL LOW (ref 20.0–28.0)
Bicarbonate: 13.4 mmol/L — ABNORMAL LOW (ref 20.0–28.0)
FIO2: 100
FIO2: 50
MECHVT: 400 mL
MECHVT: 400 mL
O2 Saturation: 99 %
O2 Saturation: 94.6 %
PEEP: 5 cmH2O
PEEP: 5 cmH2O
Patient temperature: 97.8
Patient temperature: 96
RATE: 20 resp/min
RATE: 20 resp/min
pCO2 arterial: 31.5 mmHg — ABNORMAL LOW (ref 32.0–48.0)
pCO2 arterial: 26 mmHg — ABNORMAL LOW (ref 32.0–48.0)
pH, Arterial: 7.262 — ABNORMAL LOW (ref 7.350–7.450)
pH, Arterial: 7.322 — ABNORMAL LOW (ref 7.350–7.450)
pO2, Arterial: 224 mmHg — ABNORMAL HIGH (ref 83.0–108.0)
pO2, Arterial: 78.4 mmHg — ABNORMAL LOW (ref 83.0–108.0)

## 2020-04-23 LAB — GLUCOSE, PLEURAL OR PERITONEAL FLUID: Glucose, Fluid: 103 mg/dL

## 2020-04-23 LAB — AMMONIA: Ammonia: <9 umol/L — ABNORMAL LOW (ref 9–35)

## 2020-04-23 LAB — BODY FLUID CELL COUNT WITH DIFFERENTIAL
Eos, Fluid: 0 %
Lymphs, Fluid: 46 %
Monocyte-Macrophage-Serous Fluid: 23 % — ABNORMAL LOW (ref 50–90)
Neutrophil Count, Fluid: 31 % — ABNORMAL HIGH (ref 0–25)
Total Nucleated Cell Count, Fluid: 56 cu mm (ref 0–1000)

## 2020-04-23 LAB — PROTIME-INR
INR: 1.4 — ABNORMAL HIGH (ref 0.8–1.2)
Prothrombin Time: 16.5 seconds — ABNORMAL HIGH (ref 11.4–15.2)

## 2020-04-23 LAB — PREPARE RBC (CROSSMATCH)

## 2020-04-23 LAB — LACTIC ACID, PLASMA: Lactic Acid, Venous: 1.6 mmol/L (ref 0.5–1.9)

## 2020-04-23 LAB — ETHANOL: Alcohol, Ethyl (B): <10 mg/dL (ref <10)

## 2020-04-23 LAB — GAMMA GT: GGT: 77 U/L — ABNORMAL HIGH (ref 7–50)

## 2020-04-23 LAB — CREATININE, URINE, RANDOM: Creatinine, Urine: 143.06 mg/dL

## 2020-04-23 LAB — HIV ANTIBODY (ROUTINE TESTING W REFLEX): HIV Screen 4th Generation wRfx: NONREACTIVE

## 2020-04-23 LAB — MRSA PCR SCREENING: MRSA by PCR: NEGATIVE

## 2020-04-23 LAB — LACTATE DEHYDROGENASE, PLEURAL OR PERITONEAL FLUID: LD, Fluid: 44 U/L — ABNORMAL HIGH (ref 3–23)

## 2020-04-23 LAB — OSMOLALITY: Osmolality: 267 mOsm/kg — ABNORMAL LOW (ref 275–295)

## 2020-04-23 LAB — PHOSPHORUS: Phosphorus: 3.9 mg/dL (ref 2.5–4.6)

## 2020-04-23 LAB — SARS CORONAVIRUS 2 BY RT PCR (HOSPITAL ORDER, PERFORMED IN ~~LOC~~ HOSPITAL LAB): SARS Coronavirus 2: NEGATIVE

## 2020-04-23 LAB — ABO/RH: ABO/RH(D): A POS

## 2020-04-23 LAB — TROPONIN I (HIGH SENSITIVITY)
Troponin I (High Sensitivity): 6 ng/L (ref <18)
Troponin I (High Sensitivity): 13 ng/L (ref <18)

## 2020-04-23 LAB — MAGNESIUM: Magnesium: 1.3 mg/dL — ABNORMAL LOW (ref 1.7–2.4)

## 2020-04-23 LAB — OSMOLALITY, URINE: Osmolality, Ur: 283 mOsm/kg — ABNORMAL LOW (ref 300–900)

## 2020-04-23 LAB — SODIUM, URINE, RANDOM: Sodium, Ur: 14 mmol/L

## 2020-04-23 LAB — LACTATE DEHYDROGENASE: LDH: 106 U/L (ref 98–192)

## 2020-04-23 SURGERY — ESOPHAGOGASTRODUODENOSCOPY (EGD) WITH PROPOFOL
Anesthesia: Monitor Anesthesia Care

## 2020-04-23 MED ORDER — SODIUM CHLORIDE 0.9 % IV SOLN
2.0000 g | INTRAVENOUS | Status: DC
  Administered 2020-04-23: 2 g via INTRAVENOUS
  Filled 2020-04-23: qty 2

## 2020-04-23 MED ORDER — PANTOPRAZOLE SODIUM 40 MG IV SOLR
40.0000 mg | Freq: Two times a day (BID) | INTRAVENOUS | Status: DC
  Administered 2020-04-26 – 2020-05-05 (×19): 40 mg via INTRAVENOUS
  Filled 2020-04-23 (×20): qty 40

## 2020-04-23 MED ORDER — VASOPRESSIN 20 UNITS/100 ML INFUSION FOR SHOCK
0.0000 [IU]/min | INTRAVENOUS | Status: DC
  Administered 2020-04-24 – 2020-04-27 (×8): 0.03 [IU]/min via INTRAVENOUS
  Filled 2020-04-23 (×13): qty 100

## 2020-04-23 MED ORDER — SODIUM CHLORIDE 0.9 % IV BOLUS
500.0000 mL | Freq: Once | INTRAVENOUS | Status: AC
  Administered 2020-04-23: 500 mL via INTRAVENOUS

## 2020-04-23 MED ORDER — PHENYLEPHRINE CONCENTRATED 100MG/250ML (0.4 MG/ML) INFUSION SIMPLE
25.0000 ug/min | INTRAVENOUS | Status: DC
  Administered 2020-04-23: 150 ug/min via INTRAVENOUS
  Administered 2020-04-24: 50 ug/min via INTRAVENOUS
  Administered 2020-04-24: 80 ug/min via INTRAVENOUS
  Administered 2020-04-25: 25 ug/min via INTRAVENOUS
  Filled 2020-04-23 (×2): qty 250

## 2020-04-23 MED ORDER — LIDOCAINE 2% (20 MG/ML) 5 ML SYRINGE
INTRAMUSCULAR | Status: AC
  Filled 2020-04-23: qty 5

## 2020-04-23 MED ORDER — CHLORHEXIDINE GLUCONATE 0.12 % MT SOLN
15.0000 mL | Freq: Two times a day (BID) | OROMUCOSAL | Status: DC
  Administered 2020-04-23: 15 mL via OROMUCOSAL
  Filled 2020-04-23 (×2): qty 15

## 2020-04-23 MED ORDER — MAGNESIUM SULFATE 4 GM/100ML IV SOLN
4.0000 g | Freq: Once | INTRAVENOUS | Status: AC
  Administered 2020-04-23: 4 g via INTRAVENOUS
  Filled 2020-04-23: qty 100

## 2020-04-23 MED ORDER — FENTANYL CITRATE (PF) 100 MCG/2ML IJ SOLN
50.0000 ug | INTRAMUSCULAR | Status: DC | PRN
  Administered 2020-04-23 (×2): 100 ug via INTRAVENOUS
  Administered 2020-04-24 – 2020-04-25 (×3): 50 ug via INTRAVENOUS
  Administered 2020-04-26 (×2): 100 ug via INTRAVENOUS
  Administered 2020-04-26: 50 ug via INTRAVENOUS
  Administered 2020-04-27 (×2): 100 ug via INTRAVENOUS
  Administered 2020-04-28: 50 ug via INTRAVENOUS
  Administered 2020-04-28: 25 ug via INTRAVENOUS
  Filled 2020-04-23 (×6): qty 2
  Filled 2020-04-23: qty 4
  Filled 2020-04-23 (×7): qty 2

## 2020-04-23 MED ORDER — DEXMEDETOMIDINE (PRECEDEX) IN NS 20 MCG/5ML (4 MCG/ML) IV SYRINGE
PREFILLED_SYRINGE | INTRAVENOUS | Status: DC | PRN
  Administered 2020-04-23: 8 ug via INTRAVENOUS
  Administered 2020-04-23 (×3): 4 ug via INTRAVENOUS

## 2020-04-23 MED ORDER — ALBUMIN HUMAN 5 % IV SOLN
INTRAVENOUS | Status: AC
  Filled 2020-04-23: qty 250

## 2020-04-23 MED ORDER — SUCCINYLCHOLINE CHLORIDE 20 MG/ML IJ SOLN
INTRAMUSCULAR | Status: DC | PRN
  Administered 2020-04-23: 120 mg via INTRAVENOUS

## 2020-04-23 MED ORDER — SUCCINYLCHOLINE CHLORIDE 200 MG/10ML IV SOSY
PREFILLED_SYRINGE | INTRAVENOUS | Status: AC
  Filled 2020-04-23: qty 20

## 2020-04-23 MED ORDER — SODIUM CHLORIDE 0.9 % IV SOLN
250.0000 mL | INTRAVENOUS | Status: DC
  Administered 2020-04-26 – 2020-05-01 (×3): 250 mL via INTRAVENOUS

## 2020-04-23 MED ORDER — LORAZEPAM 2 MG/ML IJ SOLN
1.0000 mg | INTRAMUSCULAR | Status: DC | PRN
  Administered 2020-04-23: 1 mg via INTRAVENOUS
  Filled 2020-04-23: qty 1

## 2020-04-23 MED ORDER — NOREPINEPHRINE 16 MG/250ML-% IV SOLN
0.0000 ug/min | INTRAVENOUS | Status: DC
  Administered 2020-04-23 – 2020-04-24 (×3): 40 ug/min via INTRAVENOUS
  Administered 2020-04-24: 35 ug/min via INTRAVENOUS
  Administered 2020-04-25 (×4): 40 ug/min via INTRAVENOUS
  Administered 2020-04-26: 28 ug/min via INTRAVENOUS
  Administered 2020-04-26: 26.027 ug/min via INTRAVENOUS
  Administered 2020-04-27: 15 ug/min via INTRAVENOUS
  Administered 2020-04-28: 10 ug/min via INTRAVENOUS
  Filled 2020-04-23 (×14): qty 250

## 2020-04-23 MED ORDER — GADOBUTROL 1 MMOL/ML IV SOLN
5.0000 mL | Freq: Once | INTRAVENOUS | Status: AC | PRN
  Administered 2020-04-23: 5 mL via INTRAVENOUS

## 2020-04-23 MED ORDER — ALBUMIN HUMAN 5 % IV SOLN
12.5000 g | Freq: Once | INTRAVENOUS | Status: AC
  Administered 2020-04-23: 12.5 g via INTRAVENOUS
  Filled 2020-04-23: qty 250

## 2020-04-23 MED ORDER — NOREPINEPHRINE 4 MG/250ML-% IV SOLN
2.0000 ug/min | INTRAVENOUS | Status: DC
  Administered 2020-04-23: 10 ug/min via INTRAVENOUS
  Filled 2020-04-23 (×3): qty 250

## 2020-04-23 MED ORDER — LACTATED RINGERS IV SOLN
INTRAVENOUS | Status: DC | PRN
Start: 2020-04-23 — End: 2020-04-23

## 2020-04-23 MED ORDER — PROPOFOL 10 MG/ML IV BOLUS
INTRAVENOUS | Status: DC | PRN
  Administered 2020-04-23 (×3): 20 mg via INTRAVENOUS

## 2020-04-23 MED ORDER — THIAMINE HCL 100 MG/ML IJ SOLN
Freq: Once | INTRAVENOUS | Status: AC
  Filled 2020-04-23: qty 1000

## 2020-04-23 MED ORDER — SODIUM BICARBONATE 8.4 % IV SOLN
50.0000 meq | Freq: Once | INTRAVENOUS | Status: AC
  Administered 2020-04-23: 50 meq via INTRAVENOUS
  Filled 2020-04-23: qty 50

## 2020-04-23 MED ORDER — ETOMIDATE 2 MG/ML IV SOLN
INTRAVENOUS | Status: AC
  Filled 2020-04-23: qty 10

## 2020-04-23 MED ORDER — POTASSIUM CHLORIDE 10 MEQ/100ML IV SOLN
10.0000 meq | INTRAVENOUS | Status: AC
  Administered 2020-04-23 (×4): 10 meq via INTRAVENOUS
  Filled 2020-04-23 (×4): qty 100

## 2020-04-23 MED ORDER — THIAMINE HCL 100 MG/ML IJ SOLN
100.0000 mg | Freq: Every day | INTRAMUSCULAR | Status: DC
  Administered 2020-04-24 – 2020-05-05 (×12): 100 mg via INTRAVENOUS
  Filled 2020-04-23 (×12): qty 2

## 2020-04-23 MED ORDER — LIDOCAINE 2% (20 MG/ML) 5 ML SYRINGE
INTRAMUSCULAR | Status: DC | PRN
  Administered 2020-04-23 (×2): 60 mg via INTRAVENOUS

## 2020-04-23 MED ORDER — SODIUM CHLORIDE 0.9 % IV SOLN: 250.0000 mL | INTRAVENOUS | Status: DC

## 2020-04-23 MED ORDER — PROPOFOL 500 MG/50ML IV EMUL
INTRAVENOUS | Status: DC | PRN
  Administered 2020-04-23: 125 ug/kg/min via INTRAVENOUS

## 2020-04-23 MED ORDER — MIDAZOLAM HCL 2 MG/2ML IJ SOLN
INTRAMUSCULAR | Status: AC
  Administered 2020-04-23: 2 mg via INTRAVENOUS
  Filled 2020-04-23: qty 2

## 2020-04-23 MED ORDER — CHLORHEXIDINE GLUCONATE CLOTH 2 % EX PADS
6.0000 | MEDICATED_PAD | Freq: Every day | CUTANEOUS | Status: DC
  Administered 2020-04-23 – 2020-05-04 (×13): 6 via TOPICAL

## 2020-04-23 MED ORDER — SODIUM CHLORIDE 0.9 % IV SOLN: INTRAVENOUS | Status: DC

## 2020-04-23 MED ORDER — THIAMINE HCL 100 MG PO TABS
100.0000 mg | ORAL_TABLET | Freq: Every day | ORAL | Status: DC
  Administered 2020-04-23: 100 mg via ORAL
  Filled 2020-04-23: qty 1

## 2020-04-23 MED ORDER — SODIUM CHLORIDE 0.9% IV SOLUTION: Freq: Once | INTRAVENOUS | Status: AC

## 2020-04-23 MED ORDER — LORAZEPAM 1 MG PO TABS: 1.0000 mg | ORAL_TABLET | ORAL | Status: DC | PRN

## 2020-04-23 MED ORDER — MAGNESIUM SULFATE 2 GM/50ML IV SOLN
2.0000 g | Freq: Once | INTRAVENOUS | Status: AC
  Administered 2020-04-23: 2 g via INTRAVENOUS
  Filled 2020-04-23: qty 50

## 2020-04-23 MED ORDER — PROMETHAZINE HCL 25 MG/ML IJ SOLN: 6.2500 mg | Freq: Four times a day (QID) | INTRAMUSCULAR | Status: DC | PRN

## 2020-04-23 MED ORDER — PHENYLEPHRINE HCL-NACL 10-0.9 MG/250ML-% IV SOLN
INTRAVENOUS | Status: AC
  Administered 2020-04-23: 75 ug/min via INTRAVENOUS
  Filled 2020-04-23: qty 250

## 2020-04-23 MED ORDER — MIDAZOLAM HCL 2 MG/2ML IJ SOLN
1.0000 mg | INTRAMUSCULAR | Status: DC | PRN
  Filled 2020-04-23: qty 2

## 2020-04-23 MED ORDER — ONDANSETRON HCL 4 MG/2ML IJ SOLN
4.0000 mg | Freq: Once | INTRAMUSCULAR | Status: AC
  Administered 2020-04-23: 4 mg via INTRAVENOUS
  Filled 2020-04-23: qty 2

## 2020-04-23 MED ORDER — FOLIC ACID 1 MG PO TABS
1.0000 mg | ORAL_TABLET | Freq: Every day | ORAL | Status: DC
  Administered 2020-04-23 – 2020-04-28 (×6): 1 mg via ORAL
  Filled 2020-04-23 (×6): qty 1

## 2020-04-23 MED ORDER — ORAL CARE MOUTH RINSE
15.0000 mL | Freq: Two times a day (BID) | OROMUCOSAL | Status: DC
  Administered 2020-04-23: 15 mL via OROMUCOSAL

## 2020-04-23 MED ORDER — SODIUM CHLORIDE 0.9 % IV SOLN
80.0000 mg | Freq: Once | INTRAVENOUS | Status: AC
  Administered 2020-04-23: 80 mg via INTRAVENOUS
  Filled 2020-04-23: qty 80

## 2020-04-23 MED ORDER — FENTANYL CITRATE (PF) 100 MCG/2ML IJ SOLN
25.0000 ug | Freq: Once | INTRAMUSCULAR | Status: AC
  Administered 2020-04-23: 25 ug via INTRAVENOUS
  Filled 2020-04-23: qty 2

## 2020-04-23 MED ORDER — DEXMEDETOMIDINE HCL IN NACL 200 MCG/50ML IV SOLN
0.0000 ug/kg/h | INTRAVENOUS | Status: AC
  Administered 2020-04-23: 0.4 ug/kg/h via INTRAVENOUS
  Administered 2020-04-23: 1 ug/kg/h via INTRAVENOUS
  Administered 2020-04-24: 0.6 ug/kg/h via INTRAVENOUS
  Administered 2020-04-24 (×2): 0.5 ug/kg/h via INTRAVENOUS
  Administered 2020-04-24 – 2020-04-25 (×3): 0.6 ug/kg/h via INTRAVENOUS
  Filled 2020-04-23 (×9): qty 50

## 2020-04-23 MED ORDER — ACETAMINOPHEN 650 MG RE SUPP: 650.0000 mg | Freq: Four times a day (QID) | RECTAL | Status: DC | PRN

## 2020-04-23 MED ORDER — ADULT MULTIVITAMIN W/MINERALS CH
1.0000 | ORAL_TABLET | Freq: Every day | ORAL | Status: DC
  Administered 2020-04-23 – 2020-04-24 (×2): 1 via ORAL
  Filled 2020-04-23 (×3): qty 1

## 2020-04-23 MED ORDER — ROCURONIUM BROMIDE 10 MG/ML (PF) SYRINGE
PREFILLED_SYRINGE | INTRAVENOUS | Status: AC
  Filled 2020-04-23: qty 20

## 2020-04-23 MED ORDER — SUCCINYLCHOLINE CHLORIDE 200 MG/10ML IV SOSY
PREFILLED_SYRINGE | INTRAVENOUS | Status: AC
  Filled 2020-04-23: qty 10

## 2020-04-23 MED ORDER — PROPOFOL 10 MG/ML IV BOLUS
INTRAVENOUS | Status: AC
  Filled 2020-04-23: qty 20

## 2020-04-23 MED ORDER — ALBUMIN HUMAN 5 % IV SOLN: INTRAVENOUS | Status: DC | PRN

## 2020-04-23 MED ORDER — ETOMIDATE 2 MG/ML IV SOLN
INTRAVENOUS | Status: DC | PRN
Start: 2020-04-23 — End: 2020-04-23
  Administered 2020-04-23: 12 mg via INTRAVENOUS

## 2020-04-23 MED ORDER — PHENYLEPHRINE HCL-NACL 10-0.9 MG/250ML-% IV SOLN
25.0000 ug/min | INTRAVENOUS | Status: DC
  Filled 2020-04-23 (×3): qty 250

## 2020-04-23 MED ORDER — NICOTINE 21 MG/24HR TD PT24: 21.0000 mg | MEDICATED_PATCH | Freq: Every day | TRANSDERMAL | Status: DC

## 2020-04-23 MED ORDER — ACETAMINOPHEN 325 MG PO TABS
650.0000 mg | ORAL_TABLET | Freq: Four times a day (QID) | ORAL | Status: DC | PRN
  Administered 2020-04-30 – 2020-05-02 (×3): 650 mg via ORAL
  Filled 2020-04-23 (×3): qty 2

## 2020-04-23 MED ORDER — SODIUM CHLORIDE 0.9 % IV SOLN
8.0000 mg/h | INTRAVENOUS | Status: AC
  Administered 2020-04-23 – 2020-04-25 (×7): 8 mg/h via INTRAVENOUS
  Filled 2020-04-23 (×9): qty 80

## 2020-04-23 MED ORDER — ALBUMIN HUMAN 5 % IV SOLN: 12.5000 g | Freq: Once | INTRAVENOUS | Status: AC

## 2020-04-23 MED ORDER — FENTANYL CITRATE (PF) 100 MCG/2ML IJ SOLN
50.0000 ug | INTRAMUSCULAR | Status: AC | PRN
  Administered 2020-04-23 (×3): 50 ug via INTRAVENOUS

## 2020-04-23 SURGICAL SUPPLY — 14 items

## 2020-04-23 NOTE — Transfer of Care (Signed)
Immediate Anesthesia Transfer of Care Note  Patient: Melissa Kent  Procedure(s) Performed: ESOPHAGOGASTRODUODENOSCOPY (EGD) WITH PROPOFOL (N/A )  Patient Location: ICU  Anesthesia Type:MAC  Level of Consciousness: sedated and drowsy  Airway & Oxygen Therapy: Patient placed on Ventilator (see vital sign flow sheet for setting)  Post-op Assessment: Report given to RN and Post -op Vital signs reviewed and unstable, Anesthesiologist notified  Post vital signs: Reviewed and unstable  Last Vitals:  Vitals Value Taken Time  BP 130/90 04/11/2020 1420  Temp 36.6 C 04/16/2020 1420  Pulse 109 03/26/2020 1428  Resp 22 04/09/2020 1428  SpO2 100 % 04/13/2020 1428  Vitals shown include unvalidated device data.  Last Pain:  Vitals:   04/04/2020 1420  TempSrc: Axillary  PainSc:          Complications: No complications documented.

## 2020-04-23 NOTE — Procedures (Addendum)
Central Venous Catheter Insertion Procedure Note  Sissy Goetzke  [id number]  12-Jul-1961  Date:03/27/2020  Time:8:53 PM   Provider Performing:Naureen Benton Rodman Pickle   Procedure: Insertion of Non-tunneled Central Venous [id number]) with US guidance (89211)   Indication(s) Medication administration  Consent Risks of the procedure as well as the alternatives and risks of each were explained to the patient and/or caregiver.  Consent for the procedure was obtained and is signed in the bedside chart  Anesthesia Topical only with 1% lidocaine   Timeout Verified patient identification, verified procedure, site/side was marked, verified correct patient position, special equipment/implants available, medications/allergies/relevant history reviewed, required imaging and test results available.  Sterile Technique Maximal sterile technique including full sterile barrier drape, hand hygiene, sterile gown, sterile gloves, mask, hair covering, sterile ultrasound probe cover (if used).  Procedure Description Area of catheter insertion was cleaned with chlorhexidine and draped in sterile fashion.  With real-time ultrasound guidance a central venous catheter was placed into the left internal jugular vein. Nonpulsatile blood flow and easy flushing noted in all ports.  The catheter was sutured in place and sterile dressing applied.  Complications/Tolerance None; patient tolerated the procedure well. Chest X-ray is ordered to verify placement for internal jugular or subclavian cannulation.   CXR @9 :03 PM reviewed. CVC appropriate. ETT needs to advanced 3 cm. Order placed.  EBL Minimal  Specimen(s) None  Rodman Pickle, M.D. [facility] Pulmonary/Critical Care Medicine 03/24/2020 8:53 PM

## 2020-04-23 NOTE — ED Provider Notes (Addendum)
[facility] DEPT Provider Note   CSN: [id number] Arrival date & time: 04/15/2020  0023     History Chief Complaint  Patient presents with  . GI Bleeding    Melissa Kent is a 59 y.o. female.  The history is provided by the patient and the EMS personnel. The history is limited by the condition of the patient.  Emesis Severity:  Moderate Timing:  Intermittent Quality:  Coffee grounds Progression:  Unchanged Chronicity:  New Recent urination:  Normal Context: not post-tussive   Relieved by:  Nothing Worsened by:  Nothing Ineffective treatments:  None tried Associated symptoms: myalgias   Associated symptoms: no abdominal pain, no fever and no URI   Associated symptoms comment:  Ascites  Risk factors: alcohol use        Past Medical History:  Diagnosis Date  . Hypertension     Patient Active Problem List   Diagnosis Date Noted  . Neutrophilia 04/29/2018    History reviewed. No pertinent surgical history.   OB History   No obstetric history on file.     History reviewed. No pertinent family history.  Social History   Tobacco Use  . Smoking status: Current Every Day Smoker    Packs/day: 1.00    Years: 20.00    Pack years: 20.00    Types: Cigarettes  . Smokeless tobacco: Never Used  Substance Use Topics  . Alcohol use: Yes    Alcohol/week: 35.0 standard drinks    Types: 35 Glasses of wine per week  . Drug use: Never    Home Medications Prior to Admission medications   Medication Sig Start Date End Date Taking? Authorizing Provider  amLODipine-valsartan (EXFORGE) 10-320 MG tablet Take 1 tablet by mouth daily.    [provider]  celecoxib (CELEBREX) 200 MG capsule Take 200 mg by mouth daily.    [provider]  cephALEXin (KEFLEX) 500 MG capsule Take 1 capsule (500 mg total) by mouth 3 (three) times daily. 03/20/20   Carlisle Cater, PA-C  gabapentin (NEURONTIN) 400 MG capsule Take 800 mg by mouth 2 (two)  times daily.    [provider]  naproxen sodium (ALEVE) 220 MG tablet Take 220 mg by mouth as needed (pain).    [provider]  ondansetron (ZOFRAN ODT) 4 MG disintegrating tablet Take 1 tablet (4 mg total) by mouth every 8 (eight) hours as needed for nausea or vomiting. 03/20/20   Carlisle Cater, PA-C  oxyCODONE (ROXICODONE) 5 MG immediate release tablet Take 1 tablet (5 mg total) by mouth every 6 (six) hours as needed for severe pain. 03/20/20   Carlisle Cater, PA-C  sodium chloride 1 g tablet Take 1 g by mouth daily.    [provider]  zolpidem (AMBIEN) 10 MG tablet Take 10 mg by mouth at bedtime as needed for sleep.    [provider]    Allergies    Patient has no known allergies.  Review of Systems   Review of Systems  Constitutional: Negative for fever.  HENT: Negative for congestion.   Eyes: Negative for visual disturbance.  Respiratory: Negative for apnea.   Cardiovascular: Negative for chest pain.  Gastrointestinal: Positive for abdominal distention and vomiting. Negative for abdominal pain.  Genitourinary: Negative for difficulty urinating.  Musculoskeletal: Positive for myalgias.  Skin: Negative for rash.  Neurological: Negative for dizziness.  Psychiatric/Behavioral: Negative for agitation.    Physical Exam Updated Vital Signs SpO2 100%   Physical Exam Vitals reviewed.  Constitutional:      Appearance: She is not diaphoretic.  HENT:     Head: Normocephalic and atraumatic.     Nose: Nose normal.     Mouth/Throat:     Pharynx: No oropharyngeal exudate.  Eyes:     Conjunctiva/sclera: Conjunctivae normal.     Pupils: Pupils are equal, round, and reactive to light.  Cardiovascular:     Rate and Rhythm: Normal rate and regular rhythm.     Pulses: Normal pulses.     Heart sounds: Normal heart sounds.  Pulmonary:     Effort: Pulmonary effort is normal.     Breath sounds: Normal breath sounds.  Abdominal:     General: Bowel  sounds are normal. There is distension.     Palpations: There is shifting dullness and fluid wave.  Musculoskeletal:        General: Normal range of motion.     Cervical back: Normal range of motion and neck supple.     Right lower leg: No edema.     Left lower leg: No edema.  Skin:    General: Skin is warm and dry.     Capillary Refill: Capillary refill takes less than 2 seconds.  Neurological:     General: No focal deficit present.     Mental Status: She is alert.     Deep Tendon Reflexes: Reflexes normal.  Psychiatric:        Mood and Affect: Mood normal.     ED Results / Procedures / Treatments   Labs (all labs ordered are listed, but only abnormal results are displayed) Results for orders placed or performed during the hospital encounter of 04/19/2020  CBC with Differential/Platelet  Result Value Ref Range   WBC 20.8 (H) 4.0 - 10.5 K/uL   RBC 2.81 (L) 3.87 - 5.11 MIL/uL   Hemoglobin 8.0 (L) 12.0 - 15.0 g/dL   HCT 25.0 (L) 36 - 46 %   MCV 89.0 80.0 - 100.0 fL   MCH 28.5 26.0 - 34.0 pg   MCHC 32.0 30.0 - 36.0 g/dL   RDW 17.0 (H) 11.5 - 15.5 %   Platelets 512 (H) 150 - 400 K/uL   nRBC 0.0 0.0 - 0.2 %   Neutrophils Relative % 89 %   Neutro Abs 18.6 (H) 1.7 - 7.7 K/uL   Lymphocytes Relative 5 %   Lymphs Abs 1.0 0.7 - 4.0 K/uL   Monocytes Relative 5 %   Monocytes Absolute 1.1 (H) 0 - 1 K/uL   Eosinophils Relative 0 %   Eosinophils Absolute 0.0 0 - 0 K/uL   Basophils Relative 0 %   Basophils Absolute 0.0 0 - 0 K/uL   Immature Granulocytes 1 %   Abs Immature Granulocytes 0.15 (H) 0.00 - 0.07 K/uL  Comprehensive metabolic panel  Result Value Ref Range   Sodium 123 (L) 135 - 145 mmol/L   Potassium 3.1 (L) 3.5 - 5.1 mmol/L   Chloride 86 (L) 98 - 111 mmol/L   CO2 16 (L) 22 - 32 mmol/L   Glucose, Bld 102 (H) 70 - 99 mg/dL   BUN 25 (H) 6 - 20 mg/dL   Creatinine, Ser 1.78 (H) 0.44 - 1.00 mg/dL   Calcium 7.1 (L) 8.9 - 10.3 mg/dL   Total Protein 6.8 6.5 - 8.1 g/dL    Albumin 2.5 (L) 3.5 - 5.0 g/dL   AST 30 15 - 41 U/L   ALT 11 0 - 44 U/L   Alkaline Phosphatase  137 (H) 38 - 126 U/L   Total Bilirubin 3.1 (H) 0.3 - 1.2 mg/dL   GFR calc non Af Amer 30 (L) >60 mL/min   GFR calc Af Amer 35 (L) >60 mL/min   Anion gap 21 (H) 5 - 15  Protime-INR  Result Value Ref Range   Prothrombin Time 16.5 (H) 11.4 - 15.2 seconds   INR 1.4 (H) 0.8 - 1.2  Ammonia  Result Value Ref Range   Ammonia <9 (L) 9 - 35 umol/L  Ethanol  Result Value Ref Range   Alcohol, Ethyl (B) <10 <10 mg/dL  Type and screen  Result Value Ref Range   ABO/RH(D) A POS    Antibody Screen      NEG Performed at [facility] [address], [place], [place] [address]    Sample Expiration PENDING    DG Chest Portable 1 View  Result Date: 04/06/2020 CLINICAL DATA:  Coffee ground emesis EXAM: PORTABLE CHEST 1 VIEW COMPARISON:  None. FINDINGS: The heart size and mediastinal contours are within normal limits. Probable subsegmental atelectasis seen at the left lung base. Healed fracture deformities are seen in the right chest wall. There is elevation of the right hemidiaphragm. IMPRESSION: Elevation of the right hemidiaphragm. Subsegmental atelectasis at the left lung base. Electronically Signed   By: Prudencio Pair M.D.   On: 04/11/2020 01:18    EKG See muse  Radiology No results found.  Procedures Procedures (including critical care time)  Medications Ordered in ED Medications  pantoprazole (PROTONIX) 80 mg in sodium chloride 0.9 % 100 mL (0.8 mg/mL) infusion (8 mg/hr Intravenous New Bag/Given 04/07/2020 0152)  pantoprazole (PROTONIX) injection 40 mg (has no administration in time range)  potassium chloride 10 mEq in 100 mL IVPB (has no administration in time range)  pantoprazole (PROTONIX) 80 mg in sodium chloride 0.9 % 100 mL IVPB (80 mg Intravenous New Bag/Given 04/12/2020 0149)  ondansetron (ZOFRAN) injection 4 mg (4 mg Intravenous Given 04/01/2020 0141)  sodium chloride 0.9 %  bolus 500 mL (500 mLs Intravenous New Bag/Given 04/22/2020 0145)  fentaNYL (SUBLIMAZE) injection 25 mcg (25 mcg Intravenous Given 04/01/2020 0220)    ED Course  I have reviewed the triage vital signs and the nursing notes.  Pertinent labs & imaging results that were available during my care of the patient were reviewed by me and considered in my medical decision making (see chart for details).    MDM Reviewed: previous chart, nursing note and vitals Reviewed previous: labs Interpretation: labs, ECG and x-ray Total time providing critical care: protonix drip for GIB. Consults: admitting MD  CRITICAL CARE Performed by: Jenny Lai K Sujata Maines-Rasch Total critical care time: 60 minutes Critical care time was exclusive of separately billable procedures and treating other patients. Critical care was necessary to treat or prevent imminent or life-threatening deterioration. Critical care was time spent personally by me on the following activities: development of treatment plan with patient and/or surrogate as well as nursing, discussions with consultants, evaluation of patient's response to treatment, examination of patient, obtaining history from patient or surrogate, ordering and performing treatments and interventions, ordering and review of laboratory studies, ordering and review of radiographic studies, pulse oximetry and re-evaluation of patient's condition.  Corrected calcium is 8.3, will not treat at this time Final Clinical Impression(s) / ED Diagnoses  Admit to medicine    [place] Sarinana, MD 03/29/2020 Beather Arbour, Marchetta Navratil, MD 04/11/2020 [id number]

## 2020-04-23 NOTE — Procedures (Signed)
Paracentesis Procedure Note  Melissa Kent  [id number]  1961/05/05  Date:04/16/2020  Time:4:40 PM   Provider Performing:Larrell Rapozo V. Elenor Wildes    Procedure: Paracentesis with imaging guidance (29937)  Indication(s) Ascites  Consent Risks of the procedure as well as the alternatives and risks of each were explained to the patient and/or caregiver.  Consent for the procedure was obtained and is signed in the bedside chart  Anesthesia Topical only with 1% lidocaine    Time Out Verified patient identification, verified procedure, site/side was marked, verified correct patient position, special equipment/implants available, medications/allergies/relevant history reviewed, required imaging and test results available.   Sterile Technique Maximal sterile technique including full sterile barrier drape, hand hygiene, sterile gown, sterile gloves, mask, hair covering, sterile ultrasound probe cover (if used).   Procedure Description Ultrasound used to identify appropriate peritoneal anatomy for placement and overlying skin marked.  Area of drainage cleaned and draped in sterile fashion. Lidocaine was used to anesthetize the skin and subcutaneous tissue.  4000 cc's of yellow appearing fluid was drained. Catheter then removed and bandaid applied to site.   Complications/Tolerance None; patient tolerated the procedure well.   EBL Minimal   Specimen(s) Peritoneal fluid  Melissa Kent V. Elsworth Soho MD

## 2020-04-23 NOTE — Progress Notes (Signed)
While recovering this patient in PACU, with Dr. Linna Caprice and Vickii Chafe, CRNA at bedside for the extent of the recovery process, patient started to decline. BP low, sats low, patient having air hunger, 2U blood ordered emergently by Dr. Linna Caprice, intubated by Linna Caprice, MD, ART line placed, chest xray was done, and albumin given. Patient taken to ICU. Handoff given at bedside to ICU RN Theodoro Doing.

## 2020-04-23 NOTE — Progress Notes (Addendum)
Events of post EGD resuscitation noted special thanks to anesthesia team and nursing staff and her overall poor condition was probably made worse by a little aspiration during the procedure and appreciate ICU care and will check on tomorrow but call sooner if I can be of any   assistance

## 2020-04-23 NOTE — Anesthesia Procedure Notes (Signed)
Procedure Name: Intubation Date/Time: 04/06/2020 1:47 PM Performed by: Sundiata Ferrick D, CRNA Pre-anesthesia Checklist: Patient identified, Emergency Drugs available, Suction available and Patient being monitored Patient Re-evaluated:Patient Re-evaluated prior to induction Oxygen Delivery Method: Circle system utilized Preoxygenation: Pre-oxygenation with 100% oxygen Induction Type: IV induction and Rapid sequence Laryngoscope Size: Mac and 3 Grade View: Grade I Tube type: Oral Tube size: 7.0 mm Number of attempts: 1 Airway Equipment and Method: Stylet and Oral airway Placement Confirmation: ETT inserted through vocal cords under direct vision,  positive ETCO2 and breath sounds checked- equal and bilateral Secured at: 21 cm Tube secured with: Tape Dental Injury: Teeth and Oropharynx as per pre-operative assessment

## 2020-04-23 NOTE — H&P (Addendum)
History and Physical    Melissa Kent SFO:[id number] DOB: 1961/07/06 DOA: 03/31/2020  PCP: Irena Reichmann, DO Patient coming from: Home  Chief Complaint: Hematemesis  HPI: Melissa Kent is a 59 y.o. female with medical history significant of alcohol use disorder, tobacco use, hypertension, neutrophilia followed by hematology presenting with complaints of coffee-ground emesis x3 days.  States she quit drinking alcohol 2 months ago but was previously drinking very heavily for about 10 years.  She was drinking several glasses of wine daily.  Reports having abdominal distention and bilateral lower extremity edema for the past 6 months.  Denies history of liver problems and she does not see a gastroenterologist.  Denies prior history of GI bleed.  Does report taking NSAIDs regularly in the past for neuropathic pain but is now using them occasionally.  She has not been eating much and has lost weight over the past few months.  For the past few days she is having intermittent sharp substernal chest pain and some shortness of breath.  Denies fevers or cough.  No other complaints.  ED Course: Slightly hypotensive with systolic in the upper 90s and tachycardic on arrival.  WBC count 20.8, was 22.5 on labs done a month ago.  Hemoglobin 8.0, stable compared to labs done a month ago.  Platelet count 512, elevated on previous labs as well.  Sodium 123, was 127 on labs done a month ago.  Potassium 3.1.  Chloride 86.  Bicarb 16, anion gap 21.  Blood glucose 102.  BUN 25, creatinine 1.7.  Creatinine was 1.0 on previous labs.  Corrected calcium 8.3.  AST and ALT normal.  Alk phos 137, T bili 3.1 (elevated on labs done a month ago as well).  INR 1.4.  Ammonia level undetectable.  Blood ethanol level undetectable.  SARS-CoV-2 PCR test pending.  Chest x-ray showing elevation of the right hemidiaphragm and subsegmental atelectasis of the left lung base.  CT renal stone study pending.  Patient was given fentanyl, Zofran,  Protonix, magnesium, potassium supplementation, 500 cc normal saline bolus, thiamine/folate/multivitamin supplementation.  Review of Systems:  All systems reviewed and apart from history of presenting illness, are negative.  Past Medical History:  Diagnosis Date  . Hypertension     History reviewed. No pertinent surgical history.   reports that she has quit smoking. Her smoking use included cigarettes. She has a 20.00 pack-year smoking history. She has never used smokeless tobacco. She reports previous alcohol use of about 35.0 standard drinks of alcohol per week. She reports that she does not use drugs.  No Known Allergies  History reviewed. No pertinent family history.  Prior to Admission medications   Medication Sig Start Date End Date Taking? Authorizing Provider  naproxen sodium (ALEVE) 220 MG tablet Take 220 mg by mouth as needed (pain).   Yes [provider]  oxyCODONE (ROXICODONE) 5 MG immediate release tablet Take 1 tablet (5 mg total) by mouth every 6 (six) hours as needed for severe pain. 03/20/20  Yes Renne Crigler, PA-C  promethazine (PHENERGAN) 25 MG tablet Take 25 mg by mouth every 6 (six) hours as needed for nausea or vomiting.   Yes [provider]  zolpidem (AMBIEN) 10 MG tablet Take 10 mg by mouth at bedtime as needed for sleep.   Yes [provider]  cephALEXin (KEFLEX) 500 MG capsule Take 1 capsule (500 mg total) by mouth 3 (three) times daily. Patient not taking: Reported on 03/28/2020 03/20/20   Renne Crigler, PA-C  ondansetron [facility]  ODT) 4 MG disintegrating tablet Take 1 tablet (4 mg total) by mouth every 8 (eight) hours as needed for nausea or vomiting. Patient not taking: Reported on 04/01/2020 03/20/20   Carlisle Cater, PA-C    Physical Exam: Vitals:   03/27/2020 0144 04/11/2020 0300 03/31/2020 0357 03/25/2020 0403  BP:  92/65  99/66  Pulse:  102  94  Resp:  21  23  Temp: 97.7 F (36.5 C)     TempSrc: Oral     SpO2:  100%  99%   Weight:   52.2 kg   Height:   _0  (1.575 m)     Physical Exam Constitutional:      Comments: Cachectic  HENT:     Head: Normocephalic and atraumatic.     Mouth/Throat:     Comments: Dried coffee-ground emesis noted around mouth Eyes:     Extraocular Movements: Extraocular movements intact.     Conjunctiva/sclera: Conjunctivae normal.  Cardiovascular:     Rate and Rhythm: Normal rate and regular rhythm.     Pulses: Normal pulses.  Pulmonary:     Effort: Pulmonary effort is normal. No respiratory distress.     Breath sounds: No wheezing or rales.  Abdominal:     General: Bowel sounds are normal. There is distension.     Tenderness: There is no abdominal tenderness. There is no guarding or rebound.     Comments: Abdomen appears significantly distended  Musculoskeletal:        General: Swelling present.     Cervical back: Normal range of motion and neck supple.     Comments: +3 pitting edema bilateral lower extremities  Skin:    General: Skin is warm and dry.  Neurological:     General: No focal deficit present.     Mental Status: She is alert and oriented to person, place, and time.     Labs on Admission: I have personally reviewed following labs and imaging studies  CBC: Recent Labs  Lab 04/21/2020 0110  WBC 20.8*  NEUTROABS 18.6*  HGB 8.0*  HCT 25.0*  MCV 89.0  PLT 329*   Basic Metabolic Panel: Recent Labs  Lab 04/06/2020 0110  NA 123*  K 3.1*  CL 86*  CO2 16*  GLUCOSE 102*  BUN 25*  CREATININE 1.78*  CALCIUM 7.1*   GFR: Estimated Creatinine Clearance: 26.6 mL/min (A) (by C-G formula based on SCr of 1.78 mg/dL (H)). Liver Function Tests: Recent Labs  Lab 03/25/2020 0110  AST 30  ALT 11  ALKPHOS 137*  BILITOT 3.1*  PROT 6.8  ALBUMIN 2.5*   No results for input(s): LIPASE, AMYLASE in the last 168 hours. Recent Labs  Lab 03/27/2020 0110  AMMONIA <9*   Coagulation Profile: Recent Labs  Lab 03/24/2020 0110  INR 1.4*   Cardiac Enzymes: No  results for input(s): CKTOTAL, CKMB, CKMBINDEX, TROPONINI in the last 168 hours. BNP (last 3 results) No results for input(s): PROBNP in the last 8760 hours. HbA1C: No results for input(s): HGBA1C in the last 72 hours. CBG: No results for input(s): GLUCAP in the last 168 hours. Lipid Profile: No results for input(s): CHOL, HDL, LDLCALC, TRIG, CHOLHDL, LDLDIRECT in the last 72 hours. Thyroid Function Tests: No results for input(s): TSH, T4TOTAL, FREET4, T3FREE, THYROIDAB in the last 72 hours. Anemia Panel: No results for input(s): VITAMINB12, FOLATE, FERRITIN, TIBC, IRON, RETICCTPCT in the last 72 hours. Urine analysis:    Component Value Date/Time   COLORURINE AMBER (A) 03/19/2020 2056  APPEARANCEUR CLOUDY (A) 03/19/2020 2056   LABSPEC 1.014 03/19/2020 2056   PHURINE 5.0 03/19/2020 2056   GLUCOSEU NEGATIVE 03/19/2020 2056   HGBUR SMALL (A) 03/19/2020 2056   BILIRUBINUR NEGATIVE 03/19/2020 2056   KETONESUR 5 (A) 03/19/2020 2056   PROTEINUR 100 (A) 03/19/2020 2056   NITRITE NEGATIVE 03/19/2020 2056   LEUKOCYTESUR MODERATE (A) 03/19/2020 2056    Radiological Exams on Admission: DG Chest Portable 1 View  Result Date: 04/04/2020 CLINICAL DATA:  Coffee ground emesis EXAM: PORTABLE CHEST 1 VIEW COMPARISON:  None. FINDINGS: The heart size and mediastinal contours are within normal limits. Probable subsegmental atelectasis seen at the left lung base. Healed fracture deformities are seen in the right chest wall. There is elevation of the right hemidiaphragm. IMPRESSION: Elevation of the right hemidiaphragm. Subsegmental atelectasis at the left lung base. Electronically Signed   By: Prudencio Pair M.D.   On: 04/02/2020 01:18   CT Renal Stone Study  Result Date: 04/08/2020 CLINICAL DATA:  GI bleed EXAM: CT ABDOMEN AND PELVIS WITHOUT CONTRAST TECHNIQUE: Multidetector CT imaging of the abdomen and pelvis was performed following the standard protocol without IV contrast. COMPARISON:  March 20, 2020 FINDINGS: Lower chest: The visualized heart size within normal limits. No pericardial fluid/thickening. There is a small hiatal hernia with fluid refluxed into the distal esophagus. Small bilateral pleural effusions are noted. Hepatobiliary: The patient's known large hypodense liver masses are again partially seen within the right liver lobe. There is a shrunken nodular appearance to the remainder of the liver. Layering calcified gallstones are present. No intrahepatic biliary ductal dilatation is noted. Pancreas:  Unremarkable.  No surrounding inflammatory changes. Spleen: Normal in size. Although limited due to the lack of intravenous contrast, normal in appearance. Adrenals/Urinary Tract: Both adrenal glands appear normal. The kidneys and collecting system appear normal without evidence of urinary tract calculus or hydronephrosis. Bladder is unremarkable. Stomach/Bowel: There appears to be diffuse wall thickening seen at the gastroduodenal junction which could be due to gastritis. The remainder of the small bowel and colon are grossly unremarkable. No evidence of obstruction. Vascular/Lymphatic: There are no enlarged abdominal or pelvic lymph nodes. Scattered aortic atherosclerotic calcifications are seen without aneurysmal dilatation. Reproductive: The uterus and adnexa are unremarkable. Other: A moderate to large amount of abdominopelvic ascites is seen throughout. There is diffuse anasarca noted. Musculoskeletal: No acute or significant osseous findings. Again noted is a slight superior compression deformity of the T9 vertebral body with less than 25% loss in height. IMPRESSION: 1. Small bilateral pleural effusions. 2. Large ill-defined hypodense liver masses, concerning for primary hepatic neoplasm, better evaluated on the prior CT of March 20, 2020. If further evaluation is required would recommend evaluation with dedicated abdominal MRI with contrast. 3. Diffuse wall thickening seen at the  gastroduodenal junction which could be due to gastritis. 4. Small hiatal hernia with fluid refluxed in the distal esophagus. 5. Findings suggestive of cirrhosis 6. Large amount of abdominopelvic ascites. Electronically Signed   By: Prudencio Pair M.D.   On: 03/25/2020 02:56    EKG: Independently reviewed.  Sinus tachycardia (heart rate 126), low voltage, QTC 489.  Assessment/Plan Principal Problem:   Upper GI bleed Active Problems:   Neutrophilia   History of alcohol use disorder   Ascites   Hyponatremia   Suspected upper GI bleed: Patient is presenting with complaints of coffee-ground emesis x3 days.  Reports history of heavy alcohol use.  She was also using NSAIDs previously for neuropathic pain.  Dried  coffee-ground material noted around the mouth.  Hypotensive with systolic in the 01E and tachycardic on arrival.  Hemoglobin 8.0, stable compared to labs done a month ago.  Differentials for upper GI bleed include gastritis, PUD, and possible varices.  No prior EGD results in the chart.  CT showing diffuse wall thickening at the GE junction which could be due to gastritis. -Admit to stepdown unit.  Keep n.p.o.  PPI bolus and infusion.  IV fluid and monitor blood pressure/hemodynamics closely.  Phenergan as needed for nausea/vomiting.  No active hematemesis since she has been in the ED. I have sent a secure chat message to Dr. Watt Climes requesting GI consult in the morning.  If there is recurrence of hematemesis or hemodynamic instability, GI will be called tonight.  Alcohol use disorder: Patient reports longstanding history of heavy alcohol use x10 years.  She states that she quit drinking 2 months ago.  Slightly tachycardic in the setting of active GI bleed.  Not displaying any other signs of withdrawal at this time. -CIWA monitoring; Ativan if needed.  Thiamine, folate, and multivitamin.  Neutrophilia: WBC count 07.1 with neutrophilic predominance.  WBC count was elevated (22.5) on labs done a month  ago as well.  No fever.  Followed by oncology-last office visit 04/29/2018.  Elevation of neutrophils was felt to be the primary reason for elevated WBC count.  This was felt to be due to infection versus inflammation from hepatitis/alcoholism. -Continue to monitor WBC count  Acute on chronic hyponatremia: Sodium 123, was 127 on labs done a month ago.  Possibly due to cirrhosis given ascites/peripheral edema and longstanding history of heavy alcohol use.  Hyponatremia could also possibly be due to poor oral intake. -Gentle IV fluid hydration at this time given GI bleed/slight hypotension.  Check serum osmolarity.  Check urine sodium and osmolarity.  Monitor BMP every 4 hours.  Ascites: Suspect due to liver cirrhosis given longstanding history of heavy alcohol use.  CT with findings suggestive of cirrhosis.  SBP less likely as abdomen is nontender to palpation and leukocytosis appears to be chronic.  No fever or altered mental status. -Order placed for IR paracentesis, labs for evaluation of ascitic fluid.  This needs to be done after EGD/when patient is stable from GI bleed standpoint.  Elevated alkaline phosphatase/T bili: Transaminases normal. Alk phos 137, T bili 3.1 (elevated on labs done a month ago as well). -Right upper quadrant ultrasound, check GGT level  Concern for hepatic neoplasm: CT showing large ill-defined hypodense liver masses concerning for primary hepatic neoplasm. -Abdominal MRI with contrast for further evaluation  Hypokalemia: Likely due to poor oral intake.  Potassium 3.1.  Received potassium and magnesium supplementation in the ED. -Continue to monitor electrolytes  High anion gap metabolic acidosis: Possibly due to alcohol use. Bicarb 16, anion gap 21.  -IV fluid hydration and continue to monitor BMP closely.  Check lactic acid level.  AKI: Likely prerenal from dehydration.  BUN 25, creatinine 1.7.  Creatinine was 1.0 on previous labs.  CT renal stone study without  evidence of urinary tract calculus or hydronephrosis. -Gentle IV fluid hydration.  Monitor renal function and urine output.  Avoid nephrotoxic agents.  Addendum: Chest pain: Appears atypical.  Possibly related to vomiting.  EKG without acute ischemic changes.  Currently chest pain-free. -Cardiac monitoring, check troponin level  DVT prophylaxis: SCDs Code Status: Full code-discussed with the patient and her husband. Family Communication: Husband at bedside. Disposition Plan: Status is: Inpatient  Remains inpatient appropriate  because:Hemodynamically unstable, Ongoing diagnostic testing needed not appropriate for outpatient work up, IV treatments appropriate due to intensity of illness or inability to take PO and Inpatient level of care appropriate due to severity of illness   Dispo: The patient is from: Home              Anticipated d/c is to: Home              Anticipated d/c date is: > 3 days              Patient currently is not medically stable to d/c.  The medical decision making on this patient was of high complexity and the patient is at high risk for clinical deterioration, therefore this is a level 3 visit.  Shela Leff MD [facility]  If 7PM-7AM, please contact night-coverage [email or web address]  04/10/2020, 4:14 AM

## 2020-04-23 NOTE — Progress Notes (Signed)
Per Dr. Elsworth Soho ok to go up to Levophed 47mcg/min through peripheral IV, and to increase Neo-synephrine to 275mcg/min through peripheral IV.

## 2020-04-23 NOTE — Anesthesia Postprocedure Evaluation (Signed)
Anesthesia Post Note  Patient: Melissa Kent  Procedure(s) Performed: ESOPHAGOGASTRODUODENOSCOPY (EGD) WITH PROPOFOL (N/A )     Patient location during evaluation: ICU Anesthesia Type: General Level of consciousness: sedated and patient remains intubated per anesthesia plan Respiratory status: patient remains intubated per anesthesia plan and patient on ventilator - see flowsheet for VS Comments: See progress note   No complications documented.  Last Vitals:  Vitals:   04/03/2020 1410 04/05/2020 1420  BP:  (!) 130/90  Pulse:  (!) 120  Resp:  16  Temp:  36.6 C  SpO2: 100%     Last Pain:  Vitals:   03/28/2020 1420  TempSrc: Axillary  PainSc:                  Yomaira Solar COKER

## 2020-04-23 NOTE — ED Triage Notes (Signed)
Patient arrives via EMS with complaints of GI bleed. Patient has had coffee ground emesis x3 days with increased weakness, patient is now non-ambulatory due to weakness. Patient states generalized body aches and nausea. 4 mg IV zofran given by EMS.

## 2020-04-23 NOTE — Transfer of Care (Deleted)
Immediate Anesthesia Transfer of Care Note  Patient: Melissa Kent  Procedure(s) Performed: ESOPHAGOGASTRODUODENOSCOPY (EGD) WITH PROPOFOL (N/A )  Patient Location: PACU  Anesthesia Type:MAC  Level of Consciousness: awake, alert  and oriented  Airway & Oxygen Therapy: Patient Spontanous Breathing and Patient connected to nasal cannula oxygen  Post-op Assessment: Report given to RN and Post -op Vital signs reviewed and stable  Post vital signs: Reviewed and stable  Last Vitals:  Vitals Value Taken Time  BP 69/46 04/10/2020 1314  Temp 36.6 C 04/08/2020 1310  Pulse 94 04/15/2020 1314  Resp 36 03/30/2020 1315  SpO2 89 % 04/21/2020 1314  Vitals shown include unvalidated device data.  Last Pain:  Vitals:   04/16/2020 1310  TempSrc: Axillary  PainSc: 0-No pain         Complications: No complications documented.

## 2020-04-23 NOTE — Anesthesia Procedure Notes (Signed)
Arterial Line Insertion Start/End07/07/2020 1:35 PM, 04/23/2020 1:40 AM Performed by: Roberts Gaudy, MD  Patient location: PACU. Preanesthetic checklist: patient identified Right, radial was placed Catheter size: 20 G Hand hygiene performed   Attempts: 1 Procedure performed without using ultrasound guided technique. Following insertion, dressing applied. Patient tolerated the procedure well with no immediate complications.

## 2020-04-23 NOTE — Op Note (Signed)
[facility] Patient Name: Melissa Kent Procedure Date: 04/06/2020 MRN: [id number] Attending MD: Clarene Essex , MD Date of Birth: Jan 26, 1961 CSN: [id number] Age: 59 Admit Type: Inpatient Procedure:                Upper GI endoscopy Indications:              Acute post hemorrhagic anemia, Coffee-ground emesis                            in patient on nonsteroidals and with cirrhosis Providers:                Clarene Essex, MD, Doristine Johns, RN, Laverda Sorenson, Technician, Tyna Jaksch Technician Referring MD:              Medicines:                Propofol total dose 510 mg IV Complications:            No immediate complications. Estimated Blood Loss:     Estimated blood loss: none. Procedure:                Pre-Anesthesia Assessment:                           - Prior to the procedure, a History and Physical                            was performed, and patient medications and                            allergies were reviewed. The patient's tolerance of                            previous anesthesia was also reviewed. The risks                            and benefits of the procedure and the sedation                            options and risks were discussed with the patient.                            All questions were answered, and informed consent                            was obtained. Prior Anticoagulants: The patient has                            taken no previous anticoagulant or antiplatelet                            agents. ASA Grade Assessment: III - A patient with  severe systemic disease. After reviewing the risks                            and benefits, the patient was deemed in                            satisfactory condition to undergo the procedure.                           After obtaining informed consent, the endoscope was                            passed under direct vision. Throughout the                             procedure, the patient's blood pressure, pulse, and                            oxygen saturations were monitored continuously. The                            GIF-H190 ([id number]) Olympus gastroscope was                            introduced through the mouth, and advanced to the                            third part of duodenum. The upper GI endoscopy was                            accomplished without difficulty. The patient                            tolerated the procedure well. Scope In: Scope Out: Findings:      A medium-sized hiatal hernia was present.      Severe esophagitis with no bleeding was found.      The entire examined stomach was normal. 450 cc of dark material was       suctioned from the stomach and esophagus but a great look at the fundus       was not obtained but no obvious significant lesion and no sign of active       bleeding      The duodenal bulb, first portion of the duodenum, second portion of the       duodenum and third portion of the duodenum were normal.      The exam was otherwise without abnormality. Impression:               - Medium-sized hiatal hernia.                           - Severe reflux esophagitis with no bleeding.                           - Normal stomach.                           -  Normal duodenal bulb, first portion of the                            duodenum, second portion of the duodenum and third                            portion of the duodenum.                           - The examination was otherwise normal.                           - No specimens collected. Moderate Sedation:      Not Applicable - Patient had care per Anesthesia. Recommendation:           - Clear liquid diet today.                           - Continue present medications. Pump inhibitors                            long-term avoid aspirin and nonsteroidals and                            alcohol long-term will need electrolyte                             normalization                           - Return to GI clinic PRN. Await MRI report                            paracentesis and recommended labs and fluid studies                            and consult note                           - Telephone GI clinic if symptomatic PRN. Will                            check on tomorrow Procedure Code(s):        --- Professional ---                           [id number], Esophagogastroduodenoscopy, flexible,                            transoral; diagnostic, including collection of                            specimen(s) by brushing or washing, when performed                            (separate procedure) Diagnosis Code(s):        ---  Professional ---                           K44.9, Diaphragmatic hernia without obstruction or                            gangrene                           K21.00, Gastro-esophageal reflux disease with                            esophagitis, without bleeding                           D62, Acute posthemorrhagic anemia                           K92.0, Hematemesis CPT copyright 2019 [employer]. All rights reserved. The codes documented in this report are preliminary and upon coder review may  be revised to meet current compliance requirements. Clarene Essex, MD 04/15/2020 12:51:28 PM This report has been signed electronically. Number of Addenda: 0

## 2020-04-23 NOTE — Anesthesia Preprocedure Evaluation (Addendum)
Anesthesia Evaluation  Patient identified by MRN, date of birth, ID bandGeneral Assessment Comment:Awake, answers questions, lethargic  Reviewed: Allergy & Precautions, NPO status , Patient's Chart, lab work & pertinent test results  Airway Mallampati: II  TM Distance: >3 FB     Dental  (+) Poor Dentition, Dental Advisory Given   Pulmonary former smoker,    + rhonchi  + decreased breath sounds      Cardiovascular hypertension,  Rhythm:Regular Rate:Tachycardia     Neuro/Psych    GI/Hepatic   Endo/Other    Renal/GU      Musculoskeletal   Abdominal (+)  Abdomen: soft.    Peds  Hematology   Anesthesia Other Findings Abdominal distention with ascites  Reproductive/Obstetrics                             Anesthesia Physical Anesthesia Plan  ASA: III  Anesthesia Plan: MAC   Post-op Pain Management:    Induction: Intravenous  PONV Risk Score and Plan: Ondansetron  Airway Management Planned: Nasal Cannula  Additional Equipment:   Intra-op Plan:   Post-operative Plan:   Informed Consent: I have reviewed the patients History and Physical, chart, labs and discussed the procedure including the risks, benefits and alternatives for the proposed anesthesia with the patient or authorized representative who has indicated his/her understanding and acceptance.     Dental advisory given  Plan Discussed with: CRNA and Anesthesiologist  Anesthesia Plan Comments:         Anesthesia Quick Evaluation

## 2020-04-23 NOTE — ED Notes (Signed)
Patient transported to MRI 

## 2020-04-23 NOTE — Consult Note (Signed)
Reason for Consult: Coffee-ground emesis in patient with cirrhosis Referring Physician: Hospital team  Melissa Kent is an 59 y.o. female.  HPI: Patient seen and examined in hospital computer chart reviewed and she has not had a previous GI work-up and no obvious GI problems run in the family and particularly no cirrhosis and she says her belly has been swollen and her legs have been swollen for some time and she lives at home with her husband and she will use some over-the-counter medicine periodically and has been on Naprosyn as well and she has no other complaints  Past Medical History:  Diagnosis Date  . Hypertension     History reviewed. No pertinent surgical history.  History reviewed. No pertinent family history.  Social History:  reports that she has quit smoking. Her smoking use included cigarettes. She has a 20.00 pack-year smoking history. She has never used smokeless tobacco. She reports previous alcohol use of about 35.0 standard drinks of alcohol per week. She reports that she does not use drugs.  Allergies: No Known Allergies  Medications: I have reviewed the patient's current medications.  Results for orders placed or performed during the hospital encounter of 04/17/2020 (from the past 48 hour(s))  CBC with Differential/Platelet     Status: Abnormal   Collection Time: 04/07/2020  1:10 AM  Result Value Ref Range   WBC 20.8 (H) 4.0 - 10.5 K/uL   RBC 2.81 (L) 3.87 - 5.11 MIL/uL   Hemoglobin 8.0 (L) 12.0 - 15.0 g/dL   HCT 25.0 (L) 36 - 46 %   MCV 89.0 80.0 - 100.0 fL   MCH 28.5 26.0 - 34.0 pg   MCHC 32.0 30.0 - 36.0 g/dL   RDW 17.0 (H) 11.5 - 15.5 %   Platelets 512 (H) 150 - 400 K/uL   nRBC 0.0 0.0 - 0.2 %   Neutrophils Relative % 89 %   Neutro Abs 18.6 (H) 1.7 - 7.7 K/uL   Lymphocytes Relative 5 %   Lymphs Abs 1.0 0.7 - 4.0 K/uL   Monocytes Relative 5 %   Monocytes Absolute 1.1 (H) 0 - 1 K/uL   Eosinophils Relative 0 %   Eosinophils Absolute 0.0 0 - 0 K/uL    Basophils Relative 0 %   Basophils Absolute 0.0 0 - 0 K/uL   Immature Granulocytes 1 %   Abs Immature Granulocytes 0.15 (H) 0.00 - 0.07 K/uL    Comment: Performed at [facility], [place] [address] [place], [place] [address]  Comprehensive metabolic panel     Status: Abnormal   Collection Time: 04/20/2020  1:10 AM  Result Value Ref Range   Sodium 123 (L) 135 - 145 mmol/L   Potassium 3.1 (L) 3.5 - 5.1 mmol/L   Chloride 86 (L) 98 - 111 mmol/L   CO2 16 (L) 22 - 32 mmol/L   Glucose, Bld 102 (H) 70 - 99 mg/dL    Comment: Glucose reference range applies only to samples taken after fasting for at least 8 hours.   BUN 25 (H) 6 - 20 mg/dL   Creatinine, Ser 1.78 (H) 0.44 - 1.00 mg/dL   Calcium 7.1 (L) 8.9 - 10.3 mg/dL   Total Protein 6.8 6.5 - 8.1 g/dL   Albumin 2.5 (L) 3.5 - 5.0 g/dL   AST 30 15 - 41 U/L   ALT 11 0 - 44 U/L   Alkaline Phosphatase 137 (H) 38 - 126 U/L   Total Bilirubin 3.1 (H) 0.3 - 1.2  mg/dL   GFR calc non Af Amer 30 (L) >60 mL/min   GFR calc Af Amer 35 (L) >60 mL/min   Anion gap 21 (H) 5 - 15    Comment: Performed at [facility], [place] [address] [place], [place] [address]  SARS Coronavirus 2 by RT PCR (hospital order, performed in [facility] hospital lab) Nasopharyngeal Nasopharyngeal Swab     Status: None   Collection Time: 03/31/2020  1:10 AM   Specimen: Nasopharyngeal Swab  Result Value Ref Range   SARS Coronavirus 2 NEGATIVE NEGATIVE    Comment: (NOTE) SARS-CoV-2 target nucleic acids are NOT DETECTED.  The SARS-CoV-2 RNA is generally detectable in upper and lower respiratory specimens during the acute phase of infection. The lowest concentration of SARS-CoV-2 viral copies this assay can detect is 250 copies / mL. A negative result does not preclude SARS-CoV-2 infection and should not be used as the sole basis for treatment or other patient management decisions.  A negative result may occur with improper specimen collection /  handling, submission of specimen other than nasopharyngeal swab, presence of viral mutation(s) within the areas targeted by this assay, and inadequate number of viral copies (<250 copies / mL). A negative result must be combined with clinical observations, patient history, and epidemiological information.  Fact Sheet for Patients:   StrictlyIdeas.no  Fact Sheet for Healthcare Providers: BankingDealers.co.za  This test is not yet approved or  cleared by the [place] FDA and has been authorized for detection and/or diagnosis of SARS-CoV-2 by FDA under an Emergency Use Authorization (EUA).  This EUA will remain in effect (meaning this test can be used) for the duration of the COVID-19 declaration under Section 564(b)(1) of the Act, 21 U.S.C. section 360bbb-3(b)(1), unless the authorization is terminated or revoked sooner.  Performed at [facility], [place] [address] [place], [place] [address]   Protime-INR     Status: Abnormal   Collection Time: 04/22/2020  1:10 AM  Result Value Ref Range   Prothrombin Time 16.5 (H) 11.4 - 15.2 seconds   INR 1.4 (H) 0.8 - 1.2    Comment: (NOTE) INR goal varies based on device and disease states. Performed at [facility], [place] [address] [place], [place] [address]   Type and screen     Status: None   Collection Time: 04/07/2020  1:10 AM  Result Value Ref Range   ABO/RH(D) A POS    Antibody Screen NEG    Sample Expiration      04/26/2020,2359 Performed at [facility], [place] [address], [place], [place] [address]   Ammonia     Status: Abnormal   Collection Time: 04/04/2020  1:10 AM  Result Value Ref Range   Ammonia <9 (L) 9 - 35 umol/L    Comment: Performed at [facility], [place] [address] [place], [place] [address]  Ethanol     Status: None   Collection Time: 03/28/2020  1:10 AM  Result Value Ref Range   Alcohol, Ethyl (B)  <10 <10 mg/dL    Comment: (NOTE) Lowest detectable limit for serum alcohol is 10 mg/dL.  For medical purposes only. Performed at [facility], [place] [address], [place], [place] [address]   ABO/Rh     Status: None   Collection Time: 04/05/2020  1:12 AM  Result Value Ref Range   ABO/RH(D)      A POS Performed at [facility], [place]  [address] [place], [place] [address]   HIV Antibody (routine testing w rflx)     Status: None   Collection Time: 04/18/2020  5:38 AM  Result Value Ref Range   HIV Screen 4th Generation wRfx Non Reactive Non Reactive    Comment: Performed at [facility], [place] [address] [place], [place] [address]  Magnesium     Status: Abnormal   Collection Time: 03/31/2020  5:38 AM  Result Value Ref Range   Magnesium 1.3 (L) 1.7 - 2.4 mg/dL    Comment: Performed at [facility], [place] [address], [place], [place] [address]  Phosphorus     Status: None   Collection Time: 04/04/2020  5:38 AM  Result Value Ref Range   Phosphorus 3.9 2.5 - 4.6 mg/dL    Comment: Performed at [facility], [place] [address] [place] [address], [place], [place] [address]  Basic metabolic panel     Status: Abnormal   Collection Time: 04/14/2020  5:38 AM  Result Value Ref Range   Sodium 122 (L) 135 - 145 mmol/L   Potassium 3.3 (L) 3.5 - 5.1 mmol/L   Chloride 88 (L) 98 - 111 mmol/L   CO2 13 (L) 22 - 32 mmol/L   Glucose, Bld 109 (H) 70 - 99 mg/dL    Comment: Glucose reference range applies only to samples taken after fasting for at least 8 hours.   BUN 25 (H) 6 - 20 mg/dL   Creatinine, Ser 1.82 (H) 0.44 - 1.00 mg/dL   Calcium 6.7 (L) 8.9 - 10.3 mg/dL   GFR calc non Af Amer 30 (L) >60 mL/min   GFR calc Af Amer 34 (L) >60 mL/min   Anion gap >20 (H) 5 - 15    Comment: Performed at [facility], [place] [address], [place], [place] [address]  Osmolality     Status: Abnormal   Collection Time: 04/17/2020  5:38 AM  Result Value Ref  Range   Osmolality 267 (L) 275 - 295 mOsm/kg    Comment: Performed at [place] [address] [place], [place] [address]  Gamma GT     Status: Abnormal   Collection Time: 03/27/2020  5:38 AM  Result Value Ref Range   GGT 77 (H) 7 - 50 U/L    Comment: Performed at [facility], [place] [address] [place], [place] [address]  Lactate dehydrogenase     Status: None   Collection Time: 04/01/2020  5:38 AM  Result Value Ref Range   LDH 106 98 - 192 U/L    Comment: Performed at [facility], [place] [address] [place], [place] [address]  Troponin I (High Sensitivity)     Status: None   Collection Time: 03/31/2020  5:38 AM  Result Value Ref Range   Troponin I (High Sensitivity) 6 <18 ng/L    Comment: (NOTE) Elevated high sensitivity troponin I (hsTnI) values and significant  changes across serial measurements may suggest ACS but many other  chronic and acute conditions are known to elevate hsTnI results.  Refer to the "Links" section for chest pain algorithms and additional  guidance. Performed at [facility], [place] [address], [place], [place] [address]   Basic metabolic panel     Status: Abnormal   Collection Time: 04/05/2020  9:20 AM  Result Value Ref Range   Sodium 123 (L) 135 - 145 mmol/L   Potassium 4.5 3.5 - 5.1 mmol/L    Comment: DELTA  CHECK NOTED NO VISIBLE HEMOLYSIS    Chloride 90 (L) 98 - 111 mmol/L   CO2 16 (L) 22 - 32 mmol/L   Glucose, Bld 108 (H) 70 - 99 mg/dL    Comment: Glucose reference range applies only to samples taken after fasting for at least 8 hours.   BUN 26 (H) 6 - 20 mg/dL   Creatinine, Ser 1.96 (H) 0.44 - 1.00 mg/dL   Calcium 6.7 (L) 8.9 - 10.3 mg/dL   GFR calc non Af Amer 27 (L) >60 mL/min   GFR calc Af Amer 31 (L) >60 mL/min   Anion gap 17 (H) 5 - 15    Comment: Performed at [facility], [place] [address], [place], [place] [address]  Lactic acid, plasma     Status: None    Collection Time: 03/31/2020  9:20 AM  Result Value Ref Range   Lactic Acid, Venous 1.6 0.5 - 1.9 mmol/L    Comment: Performed at [facility], [place] [address] [place], [place] [address]    DG Chest Portable 1 View  Result Date: 04/06/2020 CLINICAL DATA:  Coffee ground emesis EXAM: PORTABLE CHEST 1 VIEW COMPARISON:  None. FINDINGS: The heart size and mediastinal contours are within normal limits. Probable subsegmental atelectasis seen at the left lung base. Healed fracture deformities are seen in the right chest wall. There is elevation of the right hemidiaphragm. IMPRESSION: Elevation of the right hemidiaphragm. Subsegmental atelectasis at the left lung base. Electronically Signed   By: Prudencio Pair M.D.   On: 04/07/2020 01:18   CT Renal Stone Study  Result Date: 04/04/2020 CLINICAL DATA:  GI bleed EXAM: CT ABDOMEN AND PELVIS WITHOUT CONTRAST TECHNIQUE: Multidetector CT imaging of the abdomen and pelvis was performed following the standard protocol without IV contrast. COMPARISON:  March 20, 2020 FINDINGS: Lower chest: The visualized heart size within normal limits. No pericardial fluid/thickening. There is a small hiatal hernia with fluid refluxed into the distal esophagus. Small bilateral pleural effusions are noted. Hepatobiliary: The patient's known large hypodense liver masses are again partially seen within the right liver lobe. There is a shrunken nodular appearance to the remainder of the liver. Layering calcified gallstones are present. No intrahepatic biliary ductal dilatation is noted. Pancreas:  Unremarkable.  No surrounding inflammatory changes. Spleen: Normal in size. Although limited due to the lack of intravenous contrast, normal in appearance. Adrenals/Urinary Tract: Both adrenal glands appear normal. The kidneys and collecting system appear normal without evidence of urinary tract calculus or hydronephrosis. Bladder is unremarkable. Stomach/Bowel: There appears to be  diffuse wall thickening seen at the gastroduodenal junction which could be due to gastritis. The remainder of the small bowel and colon are grossly unremarkable. No evidence of obstruction. Vascular/Lymphatic: There are no enlarged abdominal or pelvic lymph nodes. Scattered aortic atherosclerotic calcifications are seen without aneurysmal dilatation. Reproductive: The uterus and adnexa are unremarkable. Other: A moderate to large amount of abdominopelvic ascites is seen throughout. There is diffuse anasarca noted. Musculoskeletal: No acute or significant osseous findings. Again noted is a slight superior compression deformity of the T9 vertebral body with less than 25% loss in height. IMPRESSION: 1. Small bilateral pleural effusions. 2. Large ill-defined hypodense liver masses, concerning for primary hepatic neoplasm, better evaluated on the prior CT of March 20, 2020. If further evaluation is required would recommend evaluation with dedicated abdominal MRI with contrast. 3. Diffuse wall thickening seen at the gastroduodenal junction which could be due to gastritis. 4. Small hiatal  hernia with fluid refluxed in the distal esophagus. 5. Findings suggestive of cirrhosis 6. Large amount of abdominopelvic ascites. Electronically Signed   By: Prudencio Pair M.D.   On: 04/15/2020 02:56   US Abdomen Limited RUQ  Result Date: 03/25/2020 CLINICAL DATA:  Initial evaluation for elevated LFTs. EXAM: ULTRASOUND ABDOMEN LIMITED RIGHT UPPER QUADRANT COMPARISON:  Prior CT from earlier the same day. FINDINGS: Gallbladder: Echogenic material within the gallbladder lumen consistent with sludge. Few scattered superimposed shadowing echogenic calculi noted as well. Gallbladder wall measures up to 4 mm in thickness. Positive sonographic Murphy sign elicited on exam. Common bile duct: Diameter: 7 mm, minimally dilated for age. Liver: Liver demonstrates a coarsened echotexture with lobulated contour. No focal intrahepatic mass is evident  by sonography. Previously noted liver masses not seen. Portal vein is patent on color Doppler imaging with normal direction of blood flow towards the liver. Other: Large volume ascites seen within the partially visualized abdomen. IMPRESSION: 1. Stones and sludge within the gallbladder lumen with associated positive sonographic Murphy sign. Mild gallbladder wall thickening could be related to intrinsic gallbladder disease or ascites. Clinical correlation for possible acute cholecystitis recommended. 2. Mild dilatation of the common bile duct up to 7 mm. No visible choledocholithiasis. 3. Coarse heterogeneous echotexture of the liver with lobulated contour. Previously identified intrahepatic masses not well seen by sonography. Abdominal MRI would be more sensitive in evaluation and characterization of these lesions. 4. Large volume ascites. Electronically Signed   By: Jeannine Boga M.D.   On: 03/30/2020 04:56    Review of Systems negative except above Blood pressure (!) 87/67, pulse 95, temperature 97.7 F (36.5 C), temperature source Oral, resp. rate 22, height 5\' 2"  (1.575 m), weight 52.2 kg, SpO2 100 %. Physical Exam patient looks ill no acute distress blood pressure little low other vital signs okay exam please see preassessment evaluation significant ascites nontender obvious pedal edema labs CT MRI and ultrasound all reviewed awaiting MRI final report Assessment/Plan: Multiple medical problems relating to cirrhosis Plan: The risks benefits methods of endoscopy was discussed with the patient and will proceed today with anesthesia assistance and she will need an alpha-fetoprotein and await MRI report and will need a paracentesis for cell count and differential Gram stain and culture albumin and cytology and then adjust diuretics accordingly and care with aspirin and nonsteroidals going forward and limit the amount of Tylenol products and pending results of MRI might need liver biopsy if cancer is  a concern  Madora Barletta E 04/22/2020, 12:11 PM

## 2020-04-23 NOTE — Anesthesia Procedure Notes (Signed)
Performed by: Melis Trochez D, CRNA       

## 2020-04-23 NOTE — Progress Notes (Signed)
Patient currently in MRI we will plan endoscopy later today and when paracentesis done recommend cell count with differential albumin and cytology and Gram stain and culture to be complete

## 2020-04-23 NOTE — Plan of Care (Signed)
PCCM Interval Note  Patient requiring max dose of Levophed @ 40 mcg/hr however remains inadequately sedated. IV access is limited and central access would be appropriate. I called husband for consent for central line placement and blood transfusion consent. Discussed risks and benefits of procedure. He consented to both procedure and blood product administration.  Rodman Pickle, M.D. [facility] Pulmonary/Critical Care Medicine 03/30/2020 7:53 PM

## 2020-04-23 NOTE — ED Notes (Signed)
Patient transported to CT 

## 2020-04-23 NOTE — Progress Notes (Signed)
Anesthesiology note:  Melissa Kent is a 59 year old female with a history of heavy alcohol abuse, smoking, hypertension, and neutrophilia who presented to the emergency department with 3 days of hematemesis.  She was found to be hypotensive with systolic blood pressures in the 80s and a white count of 20,800 and a hemoglobin of 8 with hematocrit of 25 a platelet count of 512,000 she was found to have significant abdominal distention.Marland Kitchen  She was also noted to have to have a anion gap metabolic acidosis and hyponatremia with a sodium of 123 and creatinine of 1.7 up from a baseline of 1.0.  She was brought to the endoscopy suite today for EGD and was noted to be lethargic the blood pressure of 82/60 and oxygen saturation of 88% on room air.  She underwent upper endoscopy by Dr. Watt Climes with propofol sedation and was found to have a large amount of coffee-ground emesis within her stomach.  There were no active bleeding sites and she appeared to have esophagitis and gastritis.  Following the procedure she was hypotensive with blood pressure 70 systolic and was given 094 cc of 5% albumin.  As she emerged from the anesthetic she became increasingly increasingly restless with marked respiratory distress and air hunger.  Blood gas on 10L FM revealed pH 7.32 PCO2 23 PO2 70 base deficit -12 and hematocrit less than 15.  The decision was made to intubate the patient due to her air hunger, restlessness and impending respiratory failure.  She was intubated without difficulty using etomidate and succinylcholine.  She was then brought to the ICU and placed on the ventilator.  Impression: Respiratory failure, metabolic acidosis, and upper GI bleed in patient with ascites and longstanding ETOH abuse.  Plan: 1. Transfer to ICU  2. Full ventilatory support 3. Transfuse PRBCs 4. Discussed with CCM- Dr. Sharrie Rothman [facility]

## 2020-04-23 NOTE — Progress Notes (Signed)
Notified Lab blood being sent for analysis.

## 2020-04-23 NOTE — Consult Note (Signed)
NAME:  Melissa Kent, MRN:  [id number], DOB:  05/12/1961, LOS: 0 ADMISSION DATE:  03/24/2020, CONSULTATION DATE:  03/30/2020  REFERRING MD:  Benny Lennert, [facility] COMPLAINT: Respiratory distress post EGD  Brief History   59 year old with EtOH cirrhosis and ascites admitted with coffee-ground emesis and drop in hemoglobin, underwent EGD which showed severe esophagitis , required intubation in PACU  History of present illness   She presented with coffee-ground emesis for 3 days.  She also had abdominal distention and pedal edema for 6 months.  Per patient reports she quit drinking alcohol 2 months ago. Initial labs were significant for hemoglobin of 8.0 which was stable compared to 6/26 , leukocytosis of 20K which was stable compared to 22K, hyponatremia with sodium of 123 compared to 127 on 6/26 and new renal failure with BUN/creatinine of 25/1.78 compared to normal.  INR was 1.4. She provided a history of NSAID use due to neuropathic pain. She was seen by GI and taken for endoscopy today which showed severe esophagitis , medium-sized hiatal hernia, no varices . procedure uneventful , but developed resp distress in PACU , encephalopathic, ABG >> metab acidosis with low Hct >> intubated & transferred to ICU   Past Medical History  EtOH cirrhosis, ascites Leukocytosis  Significant Hospital Events     Consults:  GI  Procedures:  EGD 7/31 severe esophagitis, no varices  Significant Diagnostic Tests:  CT renal stone study 7/30 >> small bilateral effusions, cirrhosis, small hiatal hernia, large ill-defined hypodense liver masses, large ascites  RUQ ultrasound 7/31 >> large ascites, liver mass not well visualized  MRI abdomen 7/31 >> no clear mass identified  Micro Data:    Antimicrobials:  ceftx 7/31 >>   Interim history/subjective:    Objective   Blood pressure 99/67, pulse (!) 111, temperature 97.8 F (36.6 C), temperature source Axillary, resp. rate (!) 25, height 5\' 2"  (1.575  m), weight 52.2 kg, SpO2 90 %.        Intake/Output Summary (Last 24 hours) at 04/14/2020 1409 Last data filed at 04/18/2020 1315 Gross per 24 hour  Intake 1250 ml  Output --  Net 1250 ml   Filed Weights   03/27/2020 0357  Weight: 52.2 kg    Examination: General: thin, frail,, chronically ill woman, , intubated HENT: pallor +, no icterus, no JVD, neck LNs Lungs: decreased BL, no accessory muscle use Cardiovascular: s1s2 tachy, regular, no murmur Abdomen: soft, distended, fluid thrill + Extremities: 1+ edema Neuro: sedated , unresponsive, pupils 40mm BERTL GU: clear urine  CXR 7/31 - elevated rt hemi-D, ETT in position, LLL ASD ?  Resolved Hospital Problem list     Assessment & Plan:    Postop respiratory failure -intubated post EGD -Vent settings reviewed and adjusted -Sedation protocol, goal RASS -2 with precedex/ int fent  AKI - ATN due to? NSAID use  Check FENa  Acute encephalopathy - goal RASS-1 -use precedex if BP permits & prn fentanyl   Hyponatremia, acute on chronic-sodium was noted to be 127 in 02/2020, 123 on admit  - use isotonic fluids -check BMET q 6h  UGI bleed -severe esophagitis on EGD -IV PPI twice daily  EtOH cirrhosis with ascites  -We will need ascitic fluid sampling at some point -albumin given -Empiric ceftriaxone to cover SBP & aspiration pneumonia  Liver mass -CT showed hypodense liver masses -Abdominal MRI neg   Acute blood loss anemia - check CBC q 8h, 2 U PRBC ordered by anesthesia Chronic leukocytosis-noted on labs  02/2020    Best practice:  Diet: npo Pain/Anxiety/Delirium protocol (if indicated): Precedex/fent, goal RASS -1 VAP protocol (if indicated): y DVT prophylaxis: SCDs GI prophylaxis: protonix  Glucose control: SSI Mobility: BR Code Status: full Family Communication: spouse, [name] Disposition: ICU  Labs   CBC: Recent Labs  Lab 04/01/2020 0110  WBC 20.8*  NEUTROABS 18.6*  HGB 8.0*  HCT 25.0*  MCV 89.0   PLT 512*    Basic Metabolic Panel: Recent Labs  Lab 03/24/2020 0110 03/31/2020 0538 04/21/2020 0920  NA 123* 122* 123*  K 3.1* 3.3* 4.5  CL 86* 88* 90*  CO2 16* 13* 16*  GLUCOSE 102* 109* 108*  BUN 25* 25* 26*  CREATININE 1.78* 1.82* 1.96*  CALCIUM 7.1* 6.7* 6.7*  MG  --  1.3*  --   PHOS  --  3.9  --    GFR: Estimated Creatinine Clearance: 24.1 mL/min (A) (by C-G formula based on SCr of 1.96 mg/dL (H)). Recent Labs  Lab 04/15/2020 0110 04/14/2020 0920  WBC 20.8*  --   LATICACIDVEN  --  1.6    Liver Function Tests: Recent Labs  Lab 04/18/2020 0110  AST 30  ALT 11  ALKPHOS 137*  BILITOT 3.1*  PROT 6.8  ALBUMIN 2.5*   No results for input(s): LIPASE, AMYLASE in the last 168 hours. Recent Labs  Lab 04/21/2020 0110  AMMONIA <9*    ABG No results found for: PHART, PCO2ART, PO2ART, HCO3, TCO2, ACIDBASEDEF, O2SAT   Coagulation Profile: Recent Labs  Lab 04/20/2020 0110  INR 1.4*    Cardiac Enzymes: No results for input(s): CKTOTAL, CKMB, CKMBINDEX, TROPONINI in the last 168 hours.  HbA1C: No results found for: HGBA1C  CBG: No results for input(s): GLUCAP in the last 168 hours.  Review of Systems:   Unable to obtain since intubated  Past Medical History  She,  has a past medical history of Hypertension.   Surgical History   History reviewed. No pertinent surgical history.   Social History   reports that she has quit smoking. Her smoking use included cigarettes. She has a 20.00 pack-year smoking history. She has never used smokeless tobacco. She reports previous alcohol use of about 35.0 standard drinks of alcohol per week. She reports that she does not use drugs.   Family History   Her family history is not on file.   Allergies No Known Allergies   Home Medications  Prior to Admission medications   Medication Sig Start Date End Date Taking? Authorizing Provider  naproxen sodium (ALEVE) 220 MG tablet Take 220 mg by mouth as needed (pain).   Yes [provider]  oxyCODONE (ROXICODONE) 5 MG immediate release tablet Take 1 tablet (5 mg total) by mouth every 6 (six) hours as needed for severe pain. 03/20/20  Yes Carlisle Cater, PA-C  promethazine (PHENERGAN) 25 MG tablet Take 25 mg by mouth every 6 (six) hours as needed for nausea or vomiting.   Yes [provider]  zolpidem (AMBIEN) 10 MG tablet Take 10 mg by mouth at bedtime as needed for sleep.   Yes [provider]  cephALEXin (KEFLEX) 500 MG capsule Take 1 capsule (500 mg total) by mouth 3 (three) times daily. Patient not taking: Reported on 04/05/2020 03/20/20   Carlisle Cater, PA-C  ondansetron (ZOFRAN ODT) 4 MG disintegrating tablet Take 1 tablet (4 mg total) by mouth every 8 (eight) hours as needed for nausea or vomiting. Patient not taking: Reported on 04/08/2020 03/20/20   Rondel Oh,  Vonna Kotyk, PA-C     Critical care time: [place] MD. [place] Flood. [facility]  If no response to pager , please call [phone]   04/22/2020

## 2020-04-24 ENCOUNTER — Inpatient Hospital Stay (HOSPITAL_COMMUNITY): Payer: BC Managed Care – PPO

## 2020-04-24 DIAGNOSIS — J9601 Acute respiratory failure with hypoxia: Secondary | ICD-10-CM

## 2020-04-24 LAB — BLOOD GAS, ARTERIAL
Acid-base deficit: 10.9 mmol/L — ABNORMAL HIGH (ref 0.0–2.0)
Bicarbonate: 12.8 mmol/L — ABNORMAL LOW (ref 20.0–28.0)
FIO2: 40
O2 Saturation: 98.9 %
Patient temperature: 98.6
pCO2 arterial: 23 mmHg — ABNORMAL LOW (ref 32.0–48.0)
pH, Arterial: 7.362 (ref 7.350–7.450)
pO2, Arterial: 132 mmHg — ABNORMAL HIGH (ref 83.0–108.0)

## 2020-04-24 LAB — BASIC METABOLIC PANEL
Anion gap: 16 — ABNORMAL HIGH (ref 5–15)
Anion gap: 17 — ABNORMAL HIGH (ref 5–15)
BUN: 26 mg/dL — ABNORMAL HIGH (ref 6–20)
BUN: 23 mg/dL — ABNORMAL HIGH (ref 6–20)
CO2: 16 mmol/L — ABNORMAL LOW (ref 22–32)
CO2: 14 mmol/L — ABNORMAL LOW (ref 22–32)
Calcium: 6.5 mg/dL — ABNORMAL LOW (ref 8.9–10.3)
Calcium: 6.5 mg/dL — ABNORMAL LOW (ref 8.9–10.3)
Chloride: 97 mmol/L — ABNORMAL LOW (ref 98–111)
Chloride: 97 mmol/L — ABNORMAL LOW (ref 98–111)
Creatinine, Ser: 1.65 mg/dL — ABNORMAL HIGH (ref 0.44–1.00)
Creatinine, Ser: 1.43 mg/dL — ABNORMAL HIGH (ref 0.44–1.00)
GFR calc Af Amer: 39 mL/min — ABNORMAL LOW (ref >60)
GFR calc Af Amer: 46 mL/min — ABNORMAL LOW (ref >60)
GFR calc non Af Amer: 33 mL/min — ABNORMAL LOW (ref >60)
GFR calc non Af Amer: 40 mL/min — ABNORMAL LOW (ref >60)
Glucose, Bld: 108 mg/dL — ABNORMAL HIGH (ref 70–99)
Glucose, Bld: 137 mg/dL — ABNORMAL HIGH (ref 70–99)
Potassium: 2.6 mmol/L — CL (ref 3.5–5.1)
Potassium: 4 mmol/L (ref 3.5–5.1)
Sodium: 129 mmol/L — ABNORMAL LOW (ref 135–145)
Sodium: 128 mmol/L — ABNORMAL LOW (ref 135–145)

## 2020-04-24 LAB — CBC
HCT: 26.7 % — ABNORMAL LOW (ref 36.0–46.0)
Hemoglobin: 9.2 g/dL — ABNORMAL LOW (ref 12.0–15.0)
MCH: 29.6 pg (ref 26.0–34.0)
MCHC: 34.5 g/dL (ref 30.0–36.0)
MCV: 85.9 fL (ref 80.0–100.0)
Platelets: 331 10*3/uL (ref 150–400)
RBC: 3.11 MIL/uL — ABNORMAL LOW (ref 3.87–5.11)
RDW: 15.3 % (ref 11.5–15.5)
WBC: 32.9 10*3/uL — ABNORMAL HIGH (ref 4.0–10.5)
nRBC: 0 % (ref 0.0–0.2)

## 2020-04-24 LAB — MAGNESIUM: Magnesium: 2 mg/dL (ref 1.7–2.4)

## 2020-04-24 LAB — TROPONIN I (HIGH SENSITIVITY): Troponin I (High Sensitivity): 29 ng/L — ABNORMAL HIGH (ref <18)

## 2020-04-24 MED ORDER — VANCOMYCIN HCL 500 MG/100ML IV SOLN
500.0000 mg | INTRAVENOUS | Status: DC
  Filled 2020-04-24: qty 100

## 2020-04-24 MED ORDER — POTASSIUM CHLORIDE 10 MEQ/100ML IV SOLN
10.0000 meq | INTRAVENOUS | Status: AC
  Administered 2020-04-24 (×6): 10 meq via INTRAVENOUS
  Filled 2020-04-24 (×6): qty 100

## 2020-04-24 MED ORDER — SODIUM CHLORIDE 0.9 % IV SOLN
3.0000 g | Freq: Two times a day (BID) | INTRAVENOUS | Status: DC
  Administered 2020-04-24 – 2020-04-26 (×5): 3 g via INTRAVENOUS
  Filled 2020-04-24 (×2): qty 3
  Filled 2020-04-24 (×4): qty 8

## 2020-04-24 MED ORDER — VANCOMYCIN HCL IN DEXTROSE 1-5 GM/200ML-% IV SOLN
1000.0000 mg | Freq: Once | INTRAVENOUS | Status: AC
  Administered 2020-04-24: 1000 mg via INTRAVENOUS
  Filled 2020-04-24: qty 200

## 2020-04-24 MED ORDER — ORAL CARE MOUTH RINSE
15.0000 mL | OROMUCOSAL | Status: DC
  Administered 2020-04-24 – 2020-05-02 (×69): 15 mL via OROMUCOSAL

## 2020-04-24 MED ORDER — ALBUMIN HUMAN 25 % IV SOLN
12.5000 g | Freq: Once | INTRAVENOUS | Status: AC
  Administered 2020-04-24: 12.5 g via INTRAVENOUS
  Filled 2020-04-24: qty 50

## 2020-04-24 MED ORDER — CHLORHEXIDINE GLUCONATE 0.12% ORAL RINSE (MEDLINE KIT)
15.0000 mL | Freq: Two times a day (BID) | OROMUCOSAL | Status: DC
  Administered 2020-04-24 – 2020-05-02 (×17): 15 mL via OROMUCOSAL

## 2020-04-24 NOTE — Progress Notes (Signed)
Melissa Kent 10:58 AM  Subjective: Patient seems stable this morning still intubated long conversation with her husband regarding her liver the MRI her endoscopy reflux over-the-counter medicines alcohol and tobacco etc. and I answered all of his questions  Objective: Vital signs stable afebrile currently intubated and sedated as above abdomen is soft no obvious tenderness creatinine slight increase BUN about the same increased white count hemoglobin okay posttransfusion paracentesis compatible with transudate  Assessment: Multiple medical problems including cirrhosis large hiatal hernia with reflux esophagitis  Plan: Continue pump inhibitors long-term care with over-the-counter medicines at home medical management per ICU team currently and please call us if we could be of any further assistance with this hospital stay otherwise we will follow at a distance  [facility]  office [phone] After 5PM or if no answer call [phone]

## 2020-04-24 NOTE — Plan of Care (Signed)

## 2020-04-24 NOTE — Progress Notes (Signed)
NAME:  Melissa Kent, MRN:  [id number], DOB:  06-17-1961, LOS: 1 ADMISSION DATE:  04/10/2020, CONSULTATION DATE:  04/24/2020  REFERRING MD:  Benny Lennert, [facility] COMPLAINT: Respiratory distress post EGD  Brief History   59 year old with EtOH cirrhosis and ascites admitted with coffee-ground emesis, drop in hemoglobin, hyponatremia and AKI, underwent EGD which showed severe esophagitis , required intubation in PACU postprocedure. Developed progressive shock post intubation/sedation  History of present illness   She presented with coffee-ground emesis for 3 days.  She also had abdominal distention and pedal edema for 6 months.  Per patient reports she quit drinking alcohol 2 months ago. Initial labs were significant for hemoglobin of 8.0 which was stable compared to 6/26 , leukocytosis of 20K which was stable compared to 22K, hyponatremia with sodium of 123 compared to 127 on 6/26 and new renal failure with BUN/creatinine of 25/1.78 compared to normal.  INR was 1.4. She provided a history of NSAID use due to neuropathic pain. She was seen by GI and taken for endoscopy today which showed severe esophagitis , medium-sized hiatal hernia, no varices . procedure uneventful , but developed resp distress in PACU , encephalopathic, ABG >> metab acidosis with low Hct >> intubated & transferred to ICU   Past Medical History  EtOH cirrhosis, ascites Leukocytosis  Significant Hospital Events     Consults:  GI  Procedures:  EGD 7/31 severe esophagitis, no varices 7/31 4 L paracentesis  ETT 7/31 >> LIJ 7/31 >>   Significant Diagnostic Tests:  CT renal stone study 7/30 >> small bilateral effusions, cirrhosis, small hiatal hernia, large ill-defined hypodense liver masses, large ascites  RUQ ultrasound 7/31 >> large ascites, liver mass not well visualized  MRI abdomen 7/31 >> no clear mass identified  Micro Data:  Blood 8/1 >> Ascitic fluid 7/31 >>ng  Antimicrobials:  ceftx 7/31 >>    Interim history/subjective:   Developed progressive shock post intubation and sedation Critically ill, requiring 40 mics of Levophed, Neo-Synephrine Central line placed CVP remains low Breakthrough agitation on low-dose Precedex and intermittent fentanyl  Objective   Blood pressure 102/66, pulse 89, temperature (!) 97.5 F (36.4 C), temperature source Oral, resp. rate 21, height 5\' 2"  (1.575 m), weight 52.2 kg, SpO2 95 %. CVP:  [3 mmHg-7 mmHg] 3 mmHg  Vent Mode: PRVC FiO2 (%):  [40 %-100 %] 40 % Set Rate:  [20 bmp] 20 bmp Vt Set:  [400 mL] 400 mL PEEP:  [5 cmH20] 5 cmH20 Plateau Pressure:  [10 cmH20-26 cmH20] 11 cmH20   Intake/Output Summary (Last 24 hours) at 04/24/2020 9983 Last data filed at 04/24/2020 3825 Gross per 24 hour  Intake 4955.14 ml  Output 385 ml  Net 4570.14 ml   Filed Weights   04/11/2020 0357  Weight: 52.2 kg    Examination: General: thin, frail,, chronically ill woman, , intubated HENT: pallor +, no icterus, no JVD, neck LNs Lungs: decreased BL, no accessory muscle use Cardiovascular: s1s2 tachy, regular, no murmur Abdomen: soft, less distended, fluid thrill + Extremities: 1+ edema Neuro: Intermittent agitation, RASS +2 to -1, grossly nonfocal GU: clear urine  CXR 8/1 personally reviewed, left IJ in position, left-sided airspace disease more prominent now  Labs show slightly improved hyponatremia, severe hypokalemia, stable renal function, increase leukocytosis  Resolved Hospital Problem list    Liver mass -CT showed hypodense liver masses -Abdominal MRI neg   Assessment & Plan:    Postop respiratory failure -intubated post EGD -Vent settings reviewed and adjusted -no  wean today  Septic shock likely due to aspiration pneumonia , no evidence of SBP -Obtain blood cultures, broaden antibiotics to Unasyn to cover aspiration -Obtain respiratory culture -Add vasopressin, hopefully we can taper off Neo-Synephrine and use Levophed  AKI - FENa <1  suggesting prerenal -Also low CVP, continue volume resuscitation  Acute encephalopathy - goal RASS-1 --Sedation protocol with precedex/ int fent  Hyponatremia, acute on chronic-sodium was noted to be 127 in 02/2020, 123 on admit  - use isotonic fluids -check BMET q 12h -Hypokalemia being repleted  UGI bleed -severe esophagitis on EGD -IV PPI twice daily  EtOH cirrhosis with ascites -Transudative, status post paracentesis -  Acute blood loss anemia - check CBC q 12 Chronic leukocytosis-noted on labs 02/2020, current increase likely related to infection    Best practice:  Diet: npo Pain/Anxiety/Delirium protocol (if indicated): Precedex/fent, goal RASS -1 VAP protocol (if indicated): y DVT prophylaxis: SCDs GI prophylaxis: protonix  Glucose control: SSI Mobility: BR Code Status: full Family Communication: spouse, [name] Disposition: ICU   Critical care time: [place] MD. FCCP. [facility]  If no response to pager , please call [phone]   04/24/2020

## 2020-04-24 NOTE — Progress Notes (Signed)
Pharmacy Antibiotic Note  Melissa Kent is a 59 y.o. female with a h/o ETOH cirrhosis admitted on 04/16/2020 with coffee-ground emesis, hyponatremia, and AKI.  Pharmacy has been  consulted for Unasyn and vancomycin dosing for septic shock likely related to aspiration PNA.   Plan: Unasyn 3 g iv q 12 hours.   Vancomycin 1000 mg iv once followed by 500 mg iv q 24 hours.   F/U renal function, culture results, and clinical course.   Height: 5\' 2"  (157.5 cm) Weight: 52.2 kg (115 lb) IBW/kg (Calculated) : 50.1  Temp (24hrs), Avg:97.6 F (36.4 C), Min:97.1 F (36.2 C), Max:98.6 F (37 C)  Recent Labs  Lab 04/04/2020 0110 04/15/2020 0538 04/03/2020 0920 04/10/2020 1816 03/31/2020 1911 04/24/20 0215 04/24/20 0539  WBC 20.8*  --   --  16.2*  --   --  32.9*  CREATININE 1.78* 1.82* 1.96*  --  1.54* 1.65*  --   LATICACIDVEN  --   --  1.6  --   --   --   --     Estimated Creatinine Clearance: 28.7 mL/min (A) (by C-G formula based on SCr of 1.65 mg/dL (H)).    No Known Allergies  Antimicrobials this admission: 7/31 CTX x 1 8/1 Unasyn >>  8/1 vancomycin >>  Dose adjustments this admission:  Microbiology results: 8/1 BCx: sent 7/31 peritoneal fluid:  8/1 TA:   7/31 MRSA PCR: neg  Thank you for allowing pharmacy to be a part of this patient's care.  Ulice Dash D 04/24/2020 10:15 AM

## 2020-04-24 NOTE — Progress Notes (Signed)
Notified Lab that ABG being sent for analysis. Also, collected ordered sputum sample and sent to Lab- RN aware.

## 2020-04-24 NOTE — Progress Notes (Signed)
[name] Progress Note Patient Name: Melissa Kent DOB: Sep 20, 1961 MRN: [id number]   Date of Service  04/24/2020  HPI/Events of Note  Oliguria - CVP = 3-6. Albumin = 2.5. Hgb = pending.   eICU Interventions  Plan: 1. 25% Albumin 12.5 gm IV now.      Intervention Category Major Interventions: Other:  Aiesha Leland Cornelia Copa 04/24/2020, 4:20 AM

## 2020-04-24 NOTE — Progress Notes (Signed)
[place] Progress Note Patient Name: Melissa Kent DOB: Aug 31, 1961 MRN: [id number]   Date of Service  04/24/2020  HPI/Events of Note  K+ = 2.6 and Creatinine = 1.65.   eICU Interventions  Plan: 1. Replace K+. 2. Repeat BMP at 12 noon. 3 Mg++ level STAT.     Intervention Category Major Interventions: Electrolyte abnormality - evaluation and management  Thaddaeus Granja Eugene 04/24/2020, 4:08 AM

## 2020-04-24 DEATH — deceased

## 2020-04-25 ENCOUNTER — Inpatient Hospital Stay (HOSPITAL_COMMUNITY): Payer: BC Managed Care – PPO

## 2020-04-25 ENCOUNTER — Encounter (HOSPITAL_COMMUNITY): Payer: Self-pay | Admitting: Gastroenterology

## 2020-04-25 DIAGNOSIS — J9600 Acute respiratory failure, unspecified whether with hypoxia or hypercapnia: Secondary | ICD-10-CM

## 2020-04-25 DIAGNOSIS — N179 Acute kidney failure, unspecified: Secondary | ICD-10-CM

## 2020-04-25 DIAGNOSIS — T17908A Unspecified foreign body in respiratory tract, part unspecified causing other injury, initial encounter: Secondary | ICD-10-CM

## 2020-04-25 DIAGNOSIS — E43 Unspecified severe protein-calorie malnutrition: Secondary | ICD-10-CM | POA: Insufficient documentation

## 2020-04-25 LAB — PREPARE RBC (CROSSMATCH)

## 2020-04-25 LAB — COMPREHENSIVE METABOLIC PANEL
ALT: 15 U/L (ref 0–44)
ALT: 14 U/L (ref 0–44)
ALT: 12 U/L (ref 0–44)
AST: 41 U/L (ref 15–41)
AST: 39 U/L (ref 15–41)
AST: 31 U/L (ref 15–41)
Albumin: 2.5 g/dL — ABNORMAL LOW (ref 3.5–5.0)
Albumin: 2.5 g/dL — ABNORMAL LOW (ref 3.5–5.0)
Albumin: 3.3 g/dL — ABNORMAL LOW (ref 3.5–5.0)
Alkaline Phosphatase: 102 U/L (ref 38–126)
Alkaline Phosphatase: 97 U/L (ref 38–126)
Alkaline Phosphatase: 83 U/L (ref 38–126)
Anion gap: 18 — ABNORMAL HIGH (ref 5–15)
Anion gap: 17 — ABNORMAL HIGH (ref 5–15)
Anion gap: 17 — ABNORMAL HIGH (ref 5–15)
BUN: 22 mg/dL — ABNORMAL HIGH (ref 6–20)
BUN: 23 mg/dL — ABNORMAL HIGH (ref 6–20)
BUN: 21 mg/dL — ABNORMAL HIGH (ref 6–20)
CO2: 12 mmol/L — ABNORMAL LOW (ref 22–32)
CO2: 13 mmol/L — ABNORMAL LOW (ref 22–32)
CO2: 12 mmol/L — ABNORMAL LOW (ref 22–32)
Calcium: 7 mg/dL — ABNORMAL LOW (ref 8.9–10.3)
Calcium: 6.8 mg/dL — ABNORMAL LOW (ref 8.9–10.3)
Calcium: 7 mg/dL — ABNORMAL LOW (ref 8.9–10.3)
Chloride: 99 mmol/L (ref 98–111)
Chloride: 101 mmol/L (ref 98–111)
Chloride: 101 mmol/L (ref 98–111)
Creatinine, Ser: 1.31 mg/dL — ABNORMAL HIGH (ref 0.44–1.00)
Creatinine, Ser: 1.32 mg/dL — ABNORMAL HIGH (ref 0.44–1.00)
Creatinine, Ser: 1.2 mg/dL — ABNORMAL HIGH (ref 0.44–1.00)
GFR calc Af Amer: 51 mL/min — ABNORMAL LOW (ref >60)
GFR calc Af Amer: 51 mL/min — ABNORMAL LOW (ref >60)
GFR calc Af Amer: 57 mL/min — ABNORMAL LOW (ref >60)
GFR calc non Af Amer: 44 mL/min — ABNORMAL LOW (ref >60)
GFR calc non Af Amer: 44 mL/min — ABNORMAL LOW (ref >60)
GFR calc non Af Amer: 49 mL/min — ABNORMAL LOW (ref >60)
Glucose, Bld: 126 mg/dL — ABNORMAL HIGH (ref 70–99)
Glucose, Bld: 136 mg/dL — ABNORMAL HIGH (ref 70–99)
Glucose, Bld: 181 mg/dL — ABNORMAL HIGH (ref 70–99)
Potassium: 3.5 mmol/L (ref 3.5–5.1)
Potassium: 3.6 mmol/L (ref 3.5–5.1)
Potassium: 3.9 mmol/L (ref 3.5–5.1)
Sodium: 129 mmol/L — ABNORMAL LOW (ref 135–145)
Sodium: 131 mmol/L — ABNORMAL LOW (ref 135–145)
Sodium: 130 mmol/L — ABNORMAL LOW (ref 135–145)
Total Bilirubin: 2.9 mg/dL — ABNORMAL HIGH (ref 0.3–1.2)
Total Bilirubin: 2.8 mg/dL — ABNORMAL HIGH (ref 0.3–1.2)
Total Bilirubin: 3 mg/dL — ABNORMAL HIGH (ref 0.3–1.2)
Total Protein: 5.6 g/dL — ABNORMAL LOW (ref 6.5–8.1)
Total Protein: 5.6 g/dL — ABNORMAL LOW (ref 6.5–8.1)
Total Protein: 5.8 g/dL — ABNORMAL LOW (ref 6.5–8.1)

## 2020-04-25 LAB — GLUCOSE, CAPILLARY
Glucose-Capillary: 131 mg/dL — ABNORMAL HIGH (ref 70–99)
Glucose-Capillary: 168 mg/dL — ABNORMAL HIGH (ref 70–99)
Glucose-Capillary: 165 mg/dL — ABNORMAL HIGH (ref 70–99)
Glucose-Capillary: 180 mg/dL — ABNORMAL HIGH (ref 70–99)

## 2020-04-25 LAB — CBC
HCT: 22.1 % — ABNORMAL LOW (ref 36.0–46.0)
HCT: 30.4 % — ABNORMAL LOW (ref 36.0–46.0)
HCT: 26.6 % — ABNORMAL LOW (ref 36.0–46.0)
Hemoglobin: 7.8 g/dL — ABNORMAL LOW (ref 12.0–15.0)
Hemoglobin: 10.6 g/dL — ABNORMAL LOW (ref 12.0–15.0)
Hemoglobin: 9.3 g/dL — ABNORMAL LOW (ref 12.0–15.0)
MCH: 29.8 pg (ref 26.0–34.0)
MCH: 29.4 pg (ref 26.0–34.0)
MCH: 29.7 pg (ref 26.0–34.0)
MCHC: 35.3 g/dL (ref 30.0–36.0)
MCHC: 34.9 g/dL (ref 30.0–36.0)
MCHC: 35 g/dL (ref 30.0–36.0)
MCV: 84.4 fL (ref 80.0–100.0)
MCV: 84.2 fL (ref 80.0–100.0)
MCV: 85 fL (ref 80.0–100.0)
Platelets: 296 10*3/uL (ref 150–400)
Platelets: 394 10*3/uL (ref 150–400)
Platelets: 329 10*3/uL (ref 150–400)
RBC: 2.62 MIL/uL — ABNORMAL LOW (ref 3.87–5.11)
RBC: 3.61 MIL/uL — ABNORMAL LOW (ref 3.87–5.11)
RBC: 3.13 MIL/uL — ABNORMAL LOW (ref 3.87–5.11)
RDW: 15.8 % — ABNORMAL HIGH (ref 11.5–15.5)
RDW: 15.9 % — ABNORMAL HIGH (ref 11.5–15.5)
RDW: 16 % — ABNORMAL HIGH (ref 11.5–15.5)
WBC: 18.8 10*3/uL — ABNORMAL HIGH (ref 4.0–10.5)
WBC: 22.5 10*3/uL — ABNORMAL HIGH (ref 4.0–10.5)
WBC: 17.9 10*3/uL — ABNORMAL HIGH (ref 4.0–10.5)
nRBC: 0 % (ref 0.0–0.2)
nRBC: 0 % (ref 0.0–0.2)
nRBC: 0 % (ref 0.0–0.2)

## 2020-04-25 LAB — BASIC METABOLIC PANEL
Anion gap: 12 (ref 5–15)
BUN: 17 mg/dL (ref 6–20)
CO2: 10 mmol/L — ABNORMAL LOW (ref 22–32)
Calcium: 4.8 mg/dL — CL (ref 8.9–10.3)
Chloride: 113 mmol/L — ABNORMAL HIGH (ref 98–111)
Creatinine, Ser: 0.85 mg/dL (ref 0.44–1.00)
GFR calc Af Amer: >60 mL/min (ref >60)
GFR calc non Af Amer: >60 mL/min (ref >60)
Glucose, Bld: 89 mg/dL (ref 70–99)
Potassium: 2.5 mmol/L — CL (ref 3.5–5.1)
Sodium: 135 mmol/L (ref 135–145)

## 2020-04-25 LAB — HEPATIC FUNCTION PANEL
ALT: 10 U/L (ref 0–44)
AST: 30 U/L (ref 15–41)
Albumin: 1.8 g/dL — ABNORMAL LOW (ref 3.5–5.0)
Alkaline Phosphatase: 68 U/L (ref 38–126)
Bilirubin, Direct: 1.1 mg/dL — ABNORMAL HIGH (ref 0.0–0.2)
Indirect Bilirubin: 1.1 mg/dL — ABNORMAL HIGH (ref 0.3–0.9)
Total Bilirubin: 2.2 mg/dL — ABNORMAL HIGH (ref 0.3–1.2)
Total Protein: 3.9 g/dL — ABNORMAL LOW (ref 6.5–8.1)

## 2020-04-25 LAB — MAGNESIUM
Magnesium: 1.2 mg/dL — ABNORMAL LOW (ref 1.7–2.4)
Magnesium: 2.2 mg/dL (ref 1.7–2.4)
Magnesium: 2.6 mg/dL — ABNORMAL HIGH (ref 1.7–2.4)

## 2020-04-25 LAB — LACTIC ACID, PLASMA
Lactic Acid, Venous: 2.2 mmol/L (ref 0.5–1.9)
Lactic Acid, Venous: 2 mmol/L (ref 0.5–1.9)

## 2020-04-25 LAB — PHOSPHORUS
Phosphorus: 1.9 mg/dL — ABNORMAL LOW (ref 2.5–4.6)
Phosphorus: 3.2 mg/dL (ref 2.5–4.6)
Phosphorus: 4.9 mg/dL — ABNORMAL HIGH (ref 2.5–4.6)

## 2020-04-25 LAB — CORTISOL: Cortisol, Plasma: >100 ug/dL

## 2020-04-25 MED ORDER — HYDROCORTISONE NA SUCCINATE PF 100 MG IJ SOLR
50.0000 mg | Freq: Four times a day (QID) | INTRAMUSCULAR | Status: DC
  Administered 2020-04-25 – 2020-05-01 (×24): 50 mg via INTRAVENOUS
  Filled 2020-04-25 (×23): qty 2

## 2020-04-25 MED ORDER — LORAZEPAM 2 MG/ML IJ SOLN
0.5000 mg | Freq: Four times a day (QID) | INTRAMUSCULAR | Status: DC | PRN
  Administered 2020-04-30: 0.5 mg via INTRAVENOUS
  Filled 2020-04-25: qty 1

## 2020-04-25 MED ORDER — POTASSIUM PHOSPHATES 15 MMOLE/5ML IV SOLN
30.0000 mmol | Freq: Once | INTRAVENOUS | Status: AC
  Administered 2020-04-25: 30 mmol via INTRAVENOUS
  Filled 2020-04-25: qty 10

## 2020-04-25 MED ORDER — POTASSIUM CHLORIDE 20 MEQ/15ML (10%) PO SOLN
20.0000 meq | Freq: Once | ORAL | Status: AC
  Administered 2020-04-25: 20 meq
  Filled 2020-04-25: qty 15

## 2020-04-25 MED ORDER — PROSOURCE TF PO LIQD
45.0000 mL | Freq: Two times a day (BID) | ORAL | Status: DC
  Administered 2020-04-25 – 2020-04-28 (×8): 45 mL
  Filled 2020-04-25 (×9): qty 45

## 2020-04-25 MED ORDER — ALBUMIN HUMAN 25 % IV SOLN
50.0000 g | Freq: Once | INTRAVENOUS | Status: AC
  Administered 2020-04-25: 50 g via INTRAVENOUS
  Filled 2020-04-25: qty 200

## 2020-04-25 MED ORDER — LORAZEPAM 2 MG/ML IJ SOLN
1.0000 mg | Freq: Four times a day (QID) | INTRAMUSCULAR | Status: AC
  Administered 2020-04-25 – 2020-04-26 (×4): 1 mg via INTRAVENOUS
  Filled 2020-04-25 (×4): qty 1

## 2020-04-25 MED ORDER — LORAZEPAM 2 MG/ML IJ SOLN
1.0000 mg | Freq: Three times a day (TID) | INTRAMUSCULAR | Status: AC
  Administered 2020-04-26 – 2020-04-27 (×3): 1 mg via INTRAVENOUS
  Filled 2020-04-25 (×3): qty 1

## 2020-04-25 MED ORDER — ADULT MULTIVITAMIN W/MINERALS CH
1.0000 | ORAL_TABLET | Freq: Every day | ORAL | Status: DC
  Administered 2020-04-26 – 2020-05-04 (×9): 1
  Filled 2020-04-25 (×9): qty 1

## 2020-04-25 MED ORDER — VITAL HIGH PROTEIN PO LIQD
1000.0000 mL | ORAL | Status: DC
  Administered 2020-04-25: 1000 mL

## 2020-04-25 MED ORDER — LORAZEPAM 2 MG/ML IJ SOLN
1.0000 mg | Freq: Two times a day (BID) | INTRAMUSCULAR | Status: AC
  Administered 2020-04-27 – 2020-04-28 (×2): 1 mg via INTRAVENOUS
  Filled 2020-04-25 (×2): qty 1

## 2020-04-25 MED ORDER — ALTEPLASE 2 MG IJ SOLR
2.0000 mg | Freq: Once | INTRAMUSCULAR | Status: AC
  Administered 2020-04-25: 2 mg
  Filled 2020-04-25: qty 2

## 2020-04-25 MED ORDER — MAGNESIUM SULFATE 4 GM/100ML IV SOLN
4.0000 g | Freq: Once | INTRAVENOUS | Status: AC
  Administered 2020-04-25: 4 g via INTRAVENOUS
  Filled 2020-04-25: qty 100

## 2020-04-25 MED ORDER — OSMOLITE 1.2 CAL PO LIQD
1000.0000 mL | ORAL | Status: DC
  Administered 2020-04-25 – 2020-04-28 (×4): 1000 mL

## 2020-04-25 MED FILL — Vasopressin IV Soln 20 Unit/ML (For IV Infusion): INTRAVENOUS | Qty: 1 | Status: AC

## 2020-04-25 MED FILL — Sodium Chloride IV Soln 0.9%: INTRAVENOUS | Qty: 100 | Status: AC

## 2020-04-25 NOTE — Progress Notes (Signed)
Initial Nutrition Assessment  DOCUMENTATION CODES:   Severe malnutrition in context of chronic illness  INTERVENTION:  - will order TF via NGT: Osmolite 1.2 @ 25 ml/hr to advance by 10 ml every 12 hours to reach goal rate of 45 ml/hr with 45 ml ProSource TF BID. - at goal rate, this regimen will provide 1376 kcal (103% estimated kcal need), 82 grams protein, and 886 ml free water. - free water flush per CCM.   Monitor magnesium, potassium, and phosphorus daily for at least 3 days, MD to replete as needed, as pt is at risk for refeeding syndrome given severe malnutrition, alcoholic cirrhosis, severity of edema.   NUTRITION DIAGNOSIS:   Severe Malnutrition related to chronic illness (alcoholic cirrhosis) as evidenced by moderate fat depletion, moderate muscle depletion, severe fat depletion, severe muscle depletion.  GOAL:   Patient will meet greater than or equal to 90% of their needs  MONITOR:   Vent status, TF tolerance, Labs, Weight trends  REASON FOR ASSESSMENT:   Ventilator, Consult Enteral/tube feeding initiation and management  ASSESSMENT:   59 year old female with medical history of alcoholic cirrhosis, ascites, DM, CHF, CKD, COPD, asthma, MI, and stroke. She was admitted with coffee-ground emesis, drop in Hgb, hyponatremia, and AKI. She underwent EGD which showed severe esophagitis and required intubation in PACU post-procedure. She developed progressive shock after intubation.   Patient is intubated with NGT in L nare (pending abdominal xray). No family/visitors present. Patient has not been seen by a [place] RD at any time in the past.  Weight on 7/31 was recorded as 115 lb (may be a stated weight) and weight on 03/19/20 was 120 lb. This would indicate 5 lb weight loss (4.2% body weight) in 1 month; not significant for time frame. Patient has deep pitting edema to BLE; up to knee on RLE and to mid-calf on LLE.   Per notes: - paracentesis on 7/31 with 4 L  removed--possible need for repeat prior to extubation - EGD on 7/31 which showed severe esophagitis, no varices - concern for aspiration PNA with associated septic shock - acute metabolic encephalopathy - AKI - UGIB with acute blood loss anemia   Patient is currently intubated on ventilator support MV: 13.2 L/min Temp (24hrs), Avg:97.8 F (36.6 C), Min:97.6 F (36.4 C), Max:98 F (36.7 C) Propofol: none BP: 110/58 and MAP: 78  Labs reviewed; Na: 129 mmol/l, BUN: 22 mg/dl, creatinine: 1.31 mg/dl, Ca: 7 mg/dl, GFR: 44 ml/min. Medications reviewed; 1 mg folvite/day, 50 mg solu-cortef QID, 4 g IV Mg sulfate x1 run 8/2, 10 mEq IV KCl x6 runs 8/1, 20 mEq Kcl per NGT x1 dose 8/2, 30 mmol IV KPhos x1 run 8/2, 100 mg IV thiamine/day. Drips; vaso @ 0.03 units/min, protonix @ 8 mg/hr, levo @ 40 mcg/min, neo @ 45 mcg/min, precedex @ 0.6 mcg/kg/hr.   NUTRITION - FOCUSED PHYSICAL EXAM:    Most Recent Value  Orbital Region Moderate depletion  Upper Arm Region Severe depletion  Thoracic and Lumbar Region Unable to assess  Buccal Region Moderate depletion  Temple Region Severe depletion  Clavicle Bone Region Severe depletion  Clavicle and Acromion Bone Region Severe depletion  Scapular Bone Region Severe depletion  Dorsal Hand No depletion  Patellar Region Mild depletion  Anterior Thigh Region Mild depletion  Posterior Calf Region No depletion  [masked by severity of edema]  Edema (RD Assessment) Severe  [BLE, feet to knees]  Hair Reviewed  Eyes Unable to assess  Mouth Unable to assess  Skin Reviewed  Nails Reviewed       Diet Order:   Diet Order            Diet NPO time specified  Diet effective now                 EDUCATION NEEDS:   No education needs have been identified at this time  Skin:  Skin Assessment: Reviewed RN Assessment (MASD to bilateral coccyx)  Last BM:  PTA/unknown  Height:   Ht Readings from Last 1 Encounters:  04/15/2020 5\' 2"  (1.575 m)     Weight:   Wt Readings from Last 1 Encounters:  04/08/2020 52.2 kg     Estimated Nutritional Needs:  Kcal:  1334 kcal Protein:  78-94 grams (1.5-1.8 grams/kg) Fluid:  >/= 1.5 L/day     Jarome Matin, MS, RD, LDN, CNSC Inpatient Clinical Dietitian RD pager # available in [facility]  After hours/weekend pager # available in [facility]

## 2020-04-25 NOTE — Progress Notes (Signed)
eLink Physician-Brief Progress Note Patient Name: Melissa Kent DOB: 02-01-1961 MRN: [id number]   Date of Service  04/25/2020  HPI/Events of Note  Notified that the patient had converted to atrial fibrillation. Ventricular response rate is controlled at 80-100 bpm. The patient is on levophed at 40 mcg/min.   eICU Interventions  Given that the patient's rate is not high, I am not inclined to make any changes at this time. Continue to monitor. Discussed plan with bedside RN.     Intervention Category Major Interventions: Arrhythmia - evaluation and management  Marily Lente Betzaida Cremeens 04/25/2020, 10:23 PM

## 2020-04-25 NOTE — Progress Notes (Signed)
NAME:  Melissa Kent, MRN:  [id number], DOB:  1960-09-27, LOS: 2 ADMISSION DATE:  03/30/2020, CONSULTATION DATE:  04/25/2020  REFERRING MD:  Benny Lennert, [facility] COMPLAINT: Respiratory distress post EGD  Brief History   59 year old with EtOH cirrhosis and ascites admitted with coffee-ground emesis, drop in hemoglobin, hyponatremia and AKI, underwent EGD which showed severe esophagitis , required intubation in PACU postprocedure. Developed progressive shock post intubation/sedation   Past Medical History  EtOH cirrhosis, ascites Leukocytosis  Significant Hospital Events   7/31 EGD showed esophagitis. Also had paracentesis removed 4 liters. Intubated in PACU d/t shock state resp  8/1: Breakthrough agitation on low-dose Precedex and intermittent fentanyl, CVP remains low 8/2: worsening shock over last 24 hrs. On levophed, vasopressin and phenylephrine. CVP-->still low. hgb 7.4 ->2 units total on 7/31 and 8/1, peaked at 9.2-->down again to 7.8-->transfusing 3rd unit for active shock   Consults:  GI  Procedures:  EGD 7/31 severe esophagitis, no varices 7/31 4 L paracentesis ETT 7/31 >> LIJ 7/31 >>   Significant Diagnostic Tests:  CT renal stone study 7/30 >> small bilateral effusions, cirrhosis, small hiatal hernia, large ill-defined hypodense liver masses, large ascites RUQ ultrasound 7/31 >> large ascites, liver mass not well visualized MRI abdomen 7/31 >> no clear mass identified  Micro Data:  Blood 8/1 >> Ascitic fluid 7/31 >>ng  Antimicrobials:  ceftx 7/31 >>   Interim history/subjective:     Objective   Blood pressure 96/74, pulse 81, temperature 97.7 F (36.5 C), temperature source Axillary, resp. rate 22, height 5\' 2"  (1.575 m), weight 52.2 kg, SpO2 95 %. CVP:  [4 mmHg-10 mmHg] 4 mmHg  Vent Mode: PRVC FiO2 (%):  [30 %-40 %] 30 % Set Rate:  [20 bmp] 20 bmp Vt Set:  [400 mL] 400 mL PEEP:  [5 cmH20] 5 cmH20 Plateau Pressure:  [10 GHW29-93 cmH20] 18 cmH20    Intake/Output Summary (Last 24 hours) at 04/25/2020 0912 Last data filed at 04/25/2020 0600 Gross per 24 hour  Intake 1944.62 ml  Output 675 ml  Net 1269.62 ml   Filed Weights   03/30/2020 0357  Weight: 52.2 kg    Examination:  General this is a 59 year old white female she is currently restless and anxious lying in bed she remains on full ventilatory support HEENT normocephalic atraumatic mucous membranes are dry orally intubated she does exhibit some temporal wasting Pulmonary scattered rhonchi equal bilateral chest rise currently on pressure support of 10 PEEP 5 tidal volume in the 4 to 500 cc range Cardiac regular rate and rhythm without murmur rub or gallop Abdomen soft nontender no organomegaly positive bowel sounds Extremities trace lower extremity edema brisk capillary refill warm to touch Neuro anxious moving all extremities GU concentrated urine 30 cc an hour  Resolved Hospital Problem list    Liver mass -CT showed hypodense liver masses -Abdominal MRI neg   Assessment & Plan:    Postop acute hypoxic respiratory failure with diffuse bilateral infiltrates favoring aspiration pneumonia Intubated post EGD  -Could reflect aspiration with ALI  -portable chest x-ray personally reviewed this demonstrates The endotracheal tube in satisfactory position.  The left internal jugular vein catheter in satisfactory position there is bilateral airspace disease which is patchy in nature perhaps a little better aerated on the left when comparing film from 24 hours ago, worsening aeration on the right base -Respiratory culture showing GPC in pairs and rare GNR -Afebrile, white blood cell count trending down -CVP less than 5, she is +6.3 L  at this juncture Plan Continue ventilatory support, okay to cycle on pressure support but not ready for extubation as of today Continue VAP bundle PAD protocol RASS goal 0-1 A.m. chest x-ray  Septic shock likely due to aspiration pneumonia , no  evidence of SBP -Remains dependent on 3 pressors -Suspect third spacing given low oncotic pressure -Hemoglobin drifting down once again Plan Check cortisol, add Solu-Cortef Continue to titrate pressors for mean arterial pressure greater than 65, hope to wean phenylephrine first Transfuse 1 unit of blood, indication shock with hemoglobin dropping over 1 g concerning for ongoing bleeding Continue empiric antibiotics, day #2 Unasyn and vancomycin narrow once cultures arise  Acute metabolic encephalopathy Plan Supportive care RASS goal 0 to -1 Discontinue CIWA protocol, will place on very low dose lorazepam taper given history of cirrhosis Use Precedex preferentially Continue thiamine and folate  AKI - FENa <1 suggesting prerenal--> scr worse yet again. Needs volume  Anion gap metabolic acidosis Plan Keep euvolemic  Renal dose meds as appropriate  Strict intake and output  Will trend lactate  Fluid and electrolyte balance: Hyponatremia, acute on chronic-sodium was noted to be 127 in 02/2020, 123 on admit -Has remained relatively stable and fluctuant Hypokalemia Plan Continue to monitor Continue isotonic saline Replace potassium   UGI bleed with acute blood loss anemia-severe esophagitis on EGD; hemoglobin has drifted, see above Plan Continue PPI twice daily Holding anticoagulation Initiate trickle dose feedings  EtOH cirrhosis with ascites -Transudative, status post paracentesis Plan Intermittent LFTs May need repeat paracentesis prior to extubation     Best practice:  Diet: npo-->tubefeeds  Pain/Anxiety/Delirium protocol (if indicated): Precedex/fent, goal RASS -1 VAP protocol (if indicated): y DVT prophylaxis: SCDs GI prophylaxis: protonix  Glucose control: SSI Mobility: BR Code Status: full Family Communication: spouse, [name] Disposition: ICU   Critical care time:60 minutes      Erick Colace ACNP-BC [place] Pager #  [phone] OR # [phone] if no answer  04/25/2020

## 2020-04-25 NOTE — Progress Notes (Signed)
Pharmacy Antibiotic Note  Emilia Kayes is a 59 y.o. female with a h/o ETOH cirrhosis admitted on 04/04/2020 with coffee-ground emesis, hyponatremia, and AKI.  Pharmacy has been  consulted for Unasyn and vancomycin dosing for septic shock likely related to aspiration PNA.   Day 2 Abxs WBC 18.8 improved SCr 0.85 improved CrCl 56 MRSA PCR negative  Plan: Change Unasyn from 3g q12 to 3g IV q6 due to improved renal function Continue vancomycin 500 mg IV q 24 hours for now but recommend discontinuing vanc at this point due to negative MRSA PCR F/U renal function, culture results, and clinical course.   Height: 5\' 2"  (157.5 cm) Weight: 52.2 kg (115 lb) IBW/kg (Calculated) : 50.1  Temp (24hrs), Avg:97.7 F (36.5 C), Min:97.6 F (36.4 C), Max:97.8 F (36.6 C)  Recent Labs  Lab 04/01/2020 0110 04/11/2020 0538 04/15/2020 0920 03/26/2020 1816 04/16/2020 1911 04/24/20 0215 04/24/20 0539 04/24/20 1200 04/25/20 0519  WBC 20.8*  --   --  16.2*  --   --  32.9*  --  18.8*  CREATININE 1.78*   < > 1.96*  --  1.54* 1.65*  --  1.43* 0.85  LATICACIDVEN  --   --  1.6  --   --   --   --   --   --    < > = values in this interval not displayed.    Estimated Creatinine Clearance: 55.7 mL/min (by C-G formula based on SCr of 0.85 mg/dL).    No Known Allergies  Antimicrobials this admission: 7/31 CTX x 1 8/1 Unasyn >>  8/1 vancomycin >>  Dose adjustments this admission:  Microbiology results: 8/1 BCx: sent 7/31 peritoneal fluid:  8/1 TA:  pending 7/31 MRSA PCR: neg  Thank you for allowing pharmacy to be a part of this patient's care.  Kara Mead 04/25/2020 9:06 AM

## 2020-04-25 NOTE — Progress Notes (Signed)
Pt in a-fib, EKG done and MD at [facility] notified

## 2020-04-26 ENCOUNTER — Inpatient Hospital Stay (HOSPITAL_COMMUNITY): Payer: BC Managed Care – PPO

## 2020-04-26 DIAGNOSIS — J690 Pneumonitis due to inhalation of food and vomit: Secondary | ICD-10-CM

## 2020-04-26 LAB — COMPREHENSIVE METABOLIC PANEL
ALT: 14 U/L (ref 0–44)
ALT: 12 U/L (ref 0–44)
AST: 32 U/L (ref 15–41)
AST: 26 U/L (ref 15–41)
Albumin: 3.1 g/dL — ABNORMAL LOW (ref 3.5–5.0)
Albumin: 3.5 g/dL (ref 3.5–5.0)
Alkaline Phosphatase: 92 U/L (ref 38–126)
Alkaline Phosphatase: 96 U/L (ref 38–126)
Anion gap: 14 (ref 5–15)
Anion gap: 13 (ref 5–15)
BUN: 20 mg/dL (ref 6–20)
BUN: 19 mg/dL (ref 6–20)
CO2: 17 mmol/L — ABNORMAL LOW (ref 22–32)
CO2: 16 mmol/L — ABNORMAL LOW (ref 22–32)
Calcium: 7.6 mg/dL — ABNORMAL LOW (ref 8.9–10.3)
Calcium: 7.8 mg/dL — ABNORMAL LOW (ref 8.9–10.3)
Chloride: 103 mmol/L (ref 98–111)
Chloride: 107 mmol/L (ref 98–111)
Creatinine, Ser: 1.03 mg/dL — ABNORMAL HIGH (ref 0.44–1.00)
Creatinine, Ser: 1 mg/dL (ref 0.44–1.00)
GFR calc Af Amer: >60 mL/min (ref >60)
GFR calc Af Amer: >60 mL/min (ref >60)
GFR calc non Af Amer: 59 mL/min — ABNORMAL LOW (ref >60)
GFR calc non Af Amer: >60 mL/min (ref >60)
Glucose, Bld: 175 mg/dL — ABNORMAL HIGH (ref 70–99)
Glucose, Bld: 217 mg/dL — ABNORMAL HIGH (ref 70–99)
Potassium: 3.5 mmol/L (ref 3.5–5.1)
Potassium: 3.4 mmol/L — ABNORMAL LOW (ref 3.5–5.1)
Sodium: 134 mmol/L — ABNORMAL LOW (ref 135–145)
Sodium: 136 mmol/L (ref 135–145)
Total Bilirubin: 3 mg/dL — ABNORMAL HIGH (ref 0.3–1.2)
Total Bilirubin: 2.5 mg/dL — ABNORMAL HIGH (ref 0.3–1.2)
Total Protein: 5.6 g/dL — ABNORMAL LOW (ref 6.5–8.1)
Total Protein: 5.7 g/dL — ABNORMAL LOW (ref 6.5–8.1)

## 2020-04-26 LAB — CBC
HCT: 25.4 % — ABNORMAL LOW (ref 36.0–46.0)
HCT: 24.9 % — ABNORMAL LOW (ref 36.0–46.0)
Hemoglobin: 8.9 g/dL — ABNORMAL LOW (ref 12.0–15.0)
Hemoglobin: 8.3 g/dL — ABNORMAL LOW (ref 12.0–15.0)
MCH: 29.5 pg (ref 26.0–34.0)
MCH: 28.6 pg (ref 26.0–34.0)
MCHC: 35 g/dL (ref 30.0–36.0)
MCHC: 33.3 g/dL (ref 30.0–36.0)
MCV: 84.1 fL (ref 80.0–100.0)
MCV: 85.9 fL (ref 80.0–100.0)
Platelets: 284 10*3/uL (ref 150–400)
Platelets: 219 10*3/uL (ref 150–400)
RBC: 3.02 MIL/uL — ABNORMAL LOW (ref 3.87–5.11)
RBC: 2.9 MIL/uL — ABNORMAL LOW (ref 3.87–5.11)
RDW: 16.3 % — ABNORMAL HIGH (ref 11.5–15.5)
RDW: 16.6 % — ABNORMAL HIGH (ref 11.5–15.5)
WBC: 21.7 10*3/uL — ABNORMAL HIGH (ref 4.0–10.5)
WBC: 23.3 10*3/uL — ABNORMAL HIGH (ref 4.0–10.5)
nRBC: 0 % (ref 0.0–0.2)
nRBC: 0 % (ref 0.0–0.2)

## 2020-04-26 LAB — GLUCOSE, CAPILLARY
Glucose-Capillary: 167 mg/dL — ABNORMAL HIGH (ref 70–99)
Glucose-Capillary: 167 mg/dL — ABNORMAL HIGH (ref 70–99)
Glucose-Capillary: 148 mg/dL — ABNORMAL HIGH (ref 70–99)
Glucose-Capillary: 148 mg/dL — ABNORMAL HIGH (ref 70–99)
Glucose-Capillary: 188 mg/dL — ABNORMAL HIGH (ref 70–99)
Glucose-Capillary: 217 mg/dL — ABNORMAL HIGH (ref 70–99)
Glucose-Capillary: 211 mg/dL — ABNORMAL HIGH (ref 70–99)
Glucose-Capillary: 207 mg/dL — ABNORMAL HIGH (ref 70–99)

## 2020-04-26 LAB — PHOSPHORUS
Phosphorus: 3.1 mg/dL (ref 2.5–4.6)
Phosphorus: 2.2 mg/dL — ABNORMAL LOW (ref 2.5–4.6)

## 2020-04-26 LAB — MAGNESIUM
Magnesium: 2.3 mg/dL (ref 1.7–2.4)
Magnesium: 2 mg/dL (ref 1.7–2.4)

## 2020-04-26 LAB — CYTOLOGY - NON PAP

## 2020-04-26 MED ORDER — AMIODARONE LOAD VIA INFUSION
150.0000 mg | Freq: Once | INTRAVENOUS | Status: AC
  Administered 2020-04-26: 150 mg via INTRAVENOUS
  Filled 2020-04-26: qty 83.34

## 2020-04-26 MED ORDER — SODIUM CHLORIDE 0.9 % IV SOLN
3.0000 g | Freq: Four times a day (QID) | INTRAVENOUS | Status: AC
  Administered 2020-04-26 – 2020-04-30 (×17): 3 g via INTRAVENOUS
  Filled 2020-04-26 (×3): qty 8
  Filled 2020-04-26 (×2): qty 3
  Filled 2020-04-26 (×6): qty 8
  Filled 2020-04-26: qty 3
  Filled 2020-04-26: qty 0.6
  Filled 2020-04-26: qty 3
  Filled 2020-04-26 (×3): qty 8

## 2020-04-26 MED ORDER — POTASSIUM CHLORIDE 20 MEQ/15ML (10%) PO SOLN
40.0000 meq | Freq: Once | ORAL | Status: AC
  Administered 2020-04-26: 40 meq
  Filled 2020-04-26: qty 30

## 2020-04-26 MED ORDER — AMIODARONE HCL IN DEXTROSE 360-4.14 MG/200ML-% IV SOLN
30.0000 mg/h | INTRAVENOUS | Status: DC
  Administered 2020-04-26 – 2020-04-28 (×6): 30 mg/h via INTRAVENOUS
  Filled 2020-04-26 (×5): qty 200

## 2020-04-26 MED ORDER — ALBUMIN HUMAN 25 % IV SOLN
50.0000 g | Freq: Once | INTRAVENOUS | Status: AC
  Administered 2020-04-26: 50 g via INTRAVENOUS
  Filled 2020-04-26: qty 200

## 2020-04-26 MED ORDER — FENTANYL 2500MCG IN NS 250ML (10MCG/ML) PREMIX INFUSION
0.0000 ug/h | INTRAVENOUS | Status: DC
  Administered 2020-04-26: 25 ug/h via INTRAVENOUS
  Filled 2020-04-26: qty 250

## 2020-04-26 MED ORDER — AMIODARONE HCL IN DEXTROSE 360-4.14 MG/200ML-% IV SOLN
60.0000 mg/h | INTRAVENOUS | Status: AC
  Administered 2020-04-26 (×2): 60 mg/h via INTRAVENOUS
  Filled 2020-04-26 (×2): qty 200

## 2020-04-26 MED ORDER — LACTULOSE 10 GM/15ML PO SOLN
30.0000 g | Freq: Two times a day (BID) | ORAL | Status: DC
  Administered 2020-04-26 – 2020-04-30 (×10): 30 g
  Filled 2020-04-26 (×9): qty 45

## 2020-04-26 MED ORDER — MIDODRINE HCL 5 MG PO TABS
10.0000 mg | ORAL_TABLET | Freq: Three times a day (TID) | ORAL | Status: DC
  Administered 2020-04-26 – 2020-04-28 (×7): 10 mg via ORAL
  Filled 2020-04-26 (×7): qty 2

## 2020-04-26 NOTE — Progress Notes (Signed)
NAME:  Melissa Kent, MRN:  [id number], DOB:  1961/05/29, LOS: 3 ADMISSION DATE:  04/16/2020, CONSULTATION DATE:  04/26/2020  REFERRING MD:  Benny Lennert, [facility] COMPLAINT: Respiratory distress post EGD  Brief History   60 year old with EtOH cirrhosis and ascites admitted with coffee-ground emesis, drop in hemoglobin, hyponatremia and AKI, underwent EGD which showed severe esophagitis , required intubation in PACU postprocedure. Developed progressive shock post intubation/sedation   Past Medical History  EtOH cirrhosis, ascites Leukocytosis  Significant Hospital Events   7/31 EGD showed esophagitis. Also had paracentesis removed 4 liters. Intubated in PACU d/t shock state resp  8/1: Breakthrough agitation on low-dose Precedex and intermittent fentanyl, CVP remains low 8/2: worsening shock over last 24 hrs. On levophed, vasopressin and phenylephrine. CVP-->still low. hgb 7.4 ->2 units total on 7/31 and 8/1, peaked at 9.2-->down again to 7.8-->transfusing 3rd unit for active shock  8/3: Vasoactive drip requirements improved, awake, following commands, still fairly tachypneic but chest x-ray improved off phenylephrine, still on norepinephrine as well as vasopressin Consults:  GI  Procedures:  EGD 7/31 severe esophagitis, no varices 7/31 4 L paracentesis ETT 7/31 >> LIJ 7/31 >>   Significant Diagnostic Tests:  CT renal stone study 7/30 >> small bilateral effusions, cirrhosis, small hiatal hernia, large ill-defined hypodense liver masses, large ascites RUQ ultrasound 7/31 >> large ascites, liver mass not well visualized MRI abdomen 7/31 >> no clear mass identified  Micro Data:  Blood 8/1 >> Ascitic fluid 7/31 >>ng Sputum culture 8/1: Rare GPC in pairs, rare GNR:>>> Antimicrobials:  ceftx 7/31 >> 8/1 Unasyn 8/1>>>  Interim history/subjective:  Looks a little better but still tachypneic   Objective   Blood pressure 114/73, pulse (Abnormal) 104, temperature 97.7 F (36.5 C),  temperature source Oral, resp. rate (Abnormal) 31, height 5\' 2"  (1.575 m), weight 64.1 kg, SpO2 98 %. CVP:  [3 mmHg-9 mmHg] 8 mmHg  Vent Mode: PRVC FiO2 (%):  [30 %] 30 % Set Rate:  [20 bmp] 20 bmp Vt Set:  [400 mL] 400 mL PEEP:  [5 cmH20] 5 cmH20 Pressure Support:  [10 cmH20] 10 cmH20 Plateau Pressure:  [30 cmH20] 30 cmH20   Intake/Output Summary (Last 24 hours) at 04/26/2020 0916 Last data filed at 04/26/2020 0900 Gross per 24 hour  Intake 2539.72 ml  Output 905 ml  Net 1634.72 ml   Filed Weights   04/09/2020 0357 04/26/20 0500  Weight: 52.2 kg 64.1 kg    Examination:  General 59 year old white female currently remains on mechanical ventilation.  She awakens to verbal request, follows commands, she is in no acute distress however does exhibit some accessory muscle use even on full support HEENT orally intubated normocephalic however does have some temporal wasting mucous membranes are dry sclerae nonicteric Pulmonary: Diminished bases tachypneic, mild accessory use with mild nasal flare Cardiac tachycardic regular rhythm no murmur rub or gallop Abdomen is soft, slightly distended, some distention of the umbilicus, positive bowel sounds Extremities are warm, weeping, generalized anasarca, scattered areas of ecchymosis particularly over upper extremities Neuro opens eyes, follows commands, nods appropriately, moves extremities but generally weak GU Foley catheter in place, improved urine output  Resolved Hospital Problem list    Liver mass -CT showed hypodense liver masses -Abdominal MRI neg   Assessment & Plan:    Postop acute hypoxic respiratory failure with diffuse bilateral infiltrates favoring aspiration pneumonia Intubated post EGD  Portable chest x-ray personally reviewed demonstrates improved aeration bilaterally -Respiratory culture showing GPC in pairs and rare GNR, this  is still pending -Remains afebrile, WBC count still elevated -I think shock state still precludes  extubation, may also require some gentle diuresis plus/minus paracentesis to facilitate extubation Plan Continue full ventilatory support Daily attempts at pressure support Daily assessment for SBT PAD protocol RASS goal 0 to -1 VAP bundle Unasyn day #3  Septic shock likely due to aspiration pneumonia , no evidence of SBP -Still on 2 pressors, her CVP suggest she is euvolemic, there is no active bleeding currently, hemoglobin is staying stable.  Third spacing and low oncotic pressure remained an issue she did receive albumin yesterday, also started her on Solu-Cortef, her cortisol is over 100 which I find difficult to believe I suspect this was drawn after administration of her Solu-Cortef Plan Continue stress dose steroids Keep euvolemic, in this particular case using albumin reasonable Add midodrine Titrate norepinephrine, once down to 10 mics per minute we will discontinue vasopressin Continue telemetry monitoring Monitor for evidence of bleeding Treat infection  Acute metabolic encephalopathy: This looks a little better today, she did have active history of EtOH, I think this is a mix of both toxic and metabolic encephalopathy as well as hepatic encephalopathy Plan Continue supportive care  RASS goal 0-1  Low-dose Ativan taper in place  As needed Precedex  Thiamine and folate  Add lactulose  Supportive care    AKI - FENa <1 suggesting prerenal--> scr worse yet again. Needs volume  Anion gap metabolic acidosis Anion gap closed, renal function improved Plan Keep her euvolemic Renal dose medications as indicated Avoid hypotension A.m. chemistry  Fluid and electrolyte balance: Hyponatremia, acute on chronic-sodium was noted to be 127 in 02/2020, 123 on admit -Has remained relatively stable and fluctuant  Hypokalemia Plan Keep euvolemic Replace potassium A.m. chemistry   UGI bleed with acute blood loss anemia-severe esophagitis on EGD; hemoglobin has drifted, see  above Plan Continue twice daily PPI  Holding anticoagulation  Advance tube feeds   EtOH cirrhosis with ascites -Transudative, status post paracentesis Plan Intermittent LFTs She will need GI follow-up as outpatient, she has multiple medical problems but I do believe that her cirrhosis is a central part of this, husband asking about possibility of transplant, I do not think this is completely unreasonable but she will need to be much healthier as well as demonstrate appropriate lifestyle choices and commitment to health     Best practice:  Diet: npo-->tubefeeds  Pain/Anxiety/Delirium protocol (if indicated): Precedex/fent, goal RASS -1 VAP protocol (if indicated): y DVT prophylaxis: SCDs GI prophylaxis: protonix  Glucose control: SSI Mobility: BR Code Status: full Family Communication: spouse, [name] Disposition: ICU   Critical care time: 40 minutes     Erick Colace ACNP-BC [place] Pager # [phone] OR # [phone] if no answer  04/26/2020

## 2020-04-26 NOTE — TOC Initial Note (Signed)
Transition of Care [facility]) - Initial/Assessment Note    Patient Details  Name: Melissa Kent MRN: [id number] Date of Birth: 27-Sep-1960  [facility] CM/SW Contact:    Leeroy Cha, RN Phone Number: 04/26/2020, 8:42 AM  Clinical Narrative:                 Pt with hx of etoh and drug abuse presented with hx of 3 days of coffee ground emesis. hgb 8.39, wbc 21.7, opn vent and iv sedation, Iv solu cortef, ativan , precedex, levophed and pressors.  Following for progression and needs. Expected Discharge Plan: Home/Self Care Barriers to Discharge: Continued Medical Work up   Patient Goals and CMS Choice Patient states their goals for this hospitalization and ongoing recovery are:: unable to state on vent CMS Medicare.gov Compare Post Acute Care list provided to:: Patient    Expected Discharge Plan and Services Expected Discharge Plan: Home/Self Care   Discharge Planning Services: CM Consult   Living arrangements for the past 2 months: Apartment                                      Prior Living Arrangements/Services Living arrangements for the past 2 months: Apartment Lives with:: Spouse Patient language and need for interpreter reviewed:: Yes Do you feel safe going back to the place where you live?: Yes      Need for Family Participation in Patient Care: Yes (Comment) Care giver support system in place?: Yes (comment)   Criminal Activity/Legal Involvement Pertinent to Current Situation/Hospitalization: No - Comment as needed  Activities of Daily Living      Permission Sought/Granted                  Emotional Assessment Appearance:: Appears stated age Attitude/Demeanor/Rapport: Sedated Affect (typically observed): Flat Orientation: : Fluctuating Orientation (Suspected and/or reported Sundowners) Alcohol / Substance Use: Tobacco Use, Alcohol Use, Illicit Drugs Psych Involvement: No (comment)  Admission diagnosis:  Hypokalemia  [E87.6] Hyponatremia [E87.1] Upper GI bleed [K92.2] Liver mass [R16.0] Elevated LFTs [R79.89] Acute kidney injury ([place]) [N17.9] Ascites [R18.8] Ascites due to alcoholic cirrhosis (HCC) [O84.16] Leukocytosis, unspecified type [D72.829] Patient Active Problem List   Diagnosis Date Noted  . Protein-calorie malnutrition, severe 04/25/2020  . Acute kidney injury ([place])   . Acute respiratory failure ([place])   . Aspiration into airway   . Upper GI bleed 04/17/2020  . History of alcohol use disorder 04/08/2020  . Ascites 03/30/2020  . Hyponatremia 03/27/2020  . Neutrophilia 04/29/2018   PCP:  Janie Morning, DO Pharmacy:   [facility] [address] [place] [address] Phone: [phone] Fax: [phone]     Social Determinants of Health (SDOH) Interventions    Readmission Risk Interventions No flowsheet data found.

## 2020-04-27 ENCOUNTER — Inpatient Hospital Stay (HOSPITAL_COMMUNITY): Payer: BC Managed Care – PPO

## 2020-04-27 DIAGNOSIS — A419 Sepsis, unspecified organism: Secondary | ICD-10-CM

## 2020-04-27 DIAGNOSIS — I361 Nonrheumatic tricuspid (valve) insufficiency: Secondary | ICD-10-CM

## 2020-04-27 DIAGNOSIS — I340 Nonrheumatic mitral (valve) insufficiency: Secondary | ICD-10-CM

## 2020-04-27 DIAGNOSIS — I351 Nonrheumatic aortic (valve) insufficiency: Secondary | ICD-10-CM

## 2020-04-27 LAB — TYPE AND SCREEN
ABO/RH(D): A POS
Antibody Screen: NEGATIVE
Unit division: 0
Unit division: 0
Unit division: 0

## 2020-04-27 LAB — COMPREHENSIVE METABOLIC PANEL
ALT: 12 U/L (ref 0–44)
ALT: 12 U/L (ref 0–44)
AST: 23 U/L (ref 15–41)
AST: 21 U/L (ref 15–41)
Albumin: 3.4 g/dL — ABNORMAL LOW (ref 3.5–5.0)
Albumin: 3.3 g/dL — ABNORMAL LOW (ref 3.5–5.0)
Alkaline Phosphatase: 116 U/L (ref 38–126)
Alkaline Phosphatase: 125 U/L (ref 38–126)
Anion gap: 14 (ref 5–15)
Anion gap: 9 (ref 5–15)
BUN: 21 mg/dL — ABNORMAL HIGH (ref 6–20)
BUN: 20 mg/dL (ref 6–20)
CO2: 16 mmol/L — ABNORMAL LOW (ref 22–32)
CO2: 21 mmol/L — ABNORMAL LOW (ref 22–32)
Calcium: 8.7 mg/dL — ABNORMAL LOW (ref 8.9–10.3)
Calcium: 8.9 mg/dL (ref 8.9–10.3)
Chloride: 107 mmol/L (ref 98–111)
Chloride: 109 mmol/L (ref 98–111)
Creatinine, Ser: 0.98 mg/dL (ref 0.44–1.00)
Creatinine, Ser: 0.83 mg/dL (ref 0.44–1.00)
GFR calc Af Amer: >60 mL/min (ref >60)
GFR calc Af Amer: >60 mL/min (ref >60)
GFR calc non Af Amer: >60 mL/min (ref >60)
GFR calc non Af Amer: >60 mL/min (ref >60)
Glucose, Bld: 208 mg/dL — ABNORMAL HIGH (ref 70–99)
Glucose, Bld: 203 mg/dL — ABNORMAL HIGH (ref 70–99)
Potassium: 3.5 mmol/L (ref 3.5–5.1)
Potassium: 3.8 mmol/L (ref 3.5–5.1)
Sodium: 137 mmol/L (ref 135–145)
Sodium: 139 mmol/L (ref 135–145)
Total Bilirubin: 2.3 mg/dL — ABNORMAL HIGH (ref 0.3–1.2)
Total Bilirubin: 1.8 mg/dL — ABNORMAL HIGH (ref 0.3–1.2)
Total Protein: 5.6 g/dL — ABNORMAL LOW (ref 6.5–8.1)
Total Protein: 5.8 g/dL — ABNORMAL LOW (ref 6.5–8.1)

## 2020-04-27 LAB — GLUCOSE, CAPILLARY
Glucose-Capillary: 187 mg/dL — ABNORMAL HIGH (ref 70–99)
Glucose-Capillary: 193 mg/dL — ABNORMAL HIGH (ref 70–99)
Glucose-Capillary: 194 mg/dL — ABNORMAL HIGH (ref 70–99)
Glucose-Capillary: 195 mg/dL — ABNORMAL HIGH (ref 70–99)
Glucose-Capillary: 195 mg/dL — ABNORMAL HIGH (ref 70–99)
Glucose-Capillary: 208 mg/dL — ABNORMAL HIGH (ref 70–99)

## 2020-04-27 LAB — BODY FLUID CULTURE: Culture: NO GROWTH

## 2020-04-27 LAB — BPAM RBC
Blood Product Expiration Date: 202108212359
Blood Product Expiration Date: 202108212359
Blood Product Expiration Date: 202108212359
ISSUE DATE / TIME: 202107311347
ISSUE DATE / TIME: 202107312239
Unit Type and Rh: 6200
Unit Type and Rh: 6200
Unit Type and Rh: 6200

## 2020-04-27 LAB — ECHOCARDIOGRAM COMPLETE
Area-P 1/2: 4.31 cm2
Height: 62 in
P 1/2 time: 369 msec
S' Lateral: 2.6 cm
Weight: 2261.04 oz

## 2020-04-27 LAB — CULTURE, RESPIRATORY W GRAM STAIN

## 2020-04-27 LAB — AMMONIA: Ammonia: 68 umol/L — ABNORMAL HIGH (ref 9–35)

## 2020-04-27 LAB — TROPONIN I (HIGH SENSITIVITY)
Troponin I (High Sensitivity): 64 ng/L — ABNORMAL HIGH (ref <18)
Troponin I (High Sensitivity): 53 ng/L — ABNORMAL HIGH (ref <18)

## 2020-04-27 MED ORDER — POTASSIUM CHLORIDE 20 MEQ/15ML (10%) PO SOLN
40.0000 meq | Freq: Once | ORAL | Status: AC
  Administered 2020-04-27: 40 meq
  Filled 2020-04-27: qty 30

## 2020-04-27 NOTE — Progress Notes (Signed)
  Echocardiogram 2D Echocardiogram has been performed.  Melissa Kent 04/27/2020, 1:11 PM

## 2020-04-27 NOTE — Progress Notes (Signed)
NAME:  Melissa Kent, MRN:  [id number], DOB:  [birth year], LOS: 4 ADMISSION DATE:  04/21/2020, CONSULTATION DATE:  04/27/2020  REFERRING MD:  Benny Lennert, [facility] COMPLAINT: Respiratory distress post EGD  Brief History   59 year old with EtOH cirrhosis and ascites admitted with coffee-ground emesis, drop in hemoglobin, hyponatremia and AKI, underwent EGD which showed severe esophagitis , required intubation in PACU postprocedure. Developed progressive shock post intubation/sedation   Past Medical History  EtOH cirrhosis, ascites Leukocytosis  Significant Hospital Events   7/31 EGD showed esophagitis. Also had paracentesis removed 4 liters. Intubated in PACU d/t shock state resp  8/1: Breakthrough agitation on low-dose Precedex and intermittent fentanyl, CVP remains low 8/2: worsening shock over last 24 hrs. On levophed, vasopressin and phenylephrine. CVP-->still low. hgb 7.4 ->2 units total on 7/31 and 8/1, peaked at 9.2-->down again to 7.8-->transfusing 3rd unit for active shock  8/3: Vasoactive drip requirements improved, awake, following commands, still fairly tachypneic but chest x-ray improved off phenylephrine, still on norepinephrine as well as vasopressin, developed new atrial fibrillation with rapid ventricular response and was placed on amiodarone infusion 8/4: Clinically looks about the same.  Still on vasoactive drips.  Not able to wean much.  Developed some ST changes in anterior leads, denied chest pain, had no hemodynamic changes, no worsening work of breathing Consults:  GI  Procedures:  EGD 7/31 severe esophagitis, no varices 7/31 4 L paracentesis ETT 7/31 >> LIJ 7/31 >>   Significant Diagnostic Tests:  CT renal stone study 7/30 >> small bilateral effusions, cirrhosis, small hiatal hernia, large ill-defined hypodense liver masses, large ascites RUQ ultrasound 7/31 >> large ascites, liver mass not well visualized MRI abdomen 7/31 >> no clear mass  identified Echocardiogram 8/4>>> Micro Data:  Blood 8/1 >> Ascitic fluid 7/31 >>ng Sputum culture 8/1: Rare GPC in pairs, rare GNR:>>> Antimicrobials:  ceftx 7/31 >> 8/1 Unasyn 8/1>>>  Interim history/subjective:  Looks about the same.  She is able to spontaneously ventilate on higher levels of pressure support but does develop increased work of breathing   Objective   Blood pressure 94/71, pulse 88, temperature 97.6 F (36.4 C), temperature source Axillary, resp. rate (Abnormal) 21, height 5\' 2"  (1.575 m), weight 64.1 kg, SpO2 100 %. CVP:  [14 mmHg-18 mmHg] 15 mmHg  Vent Mode: PRVC FiO2 (%):  [30 %] 30 % Set Rate:  [20 bmp] 20 bmp Vt Set:  [400 mL] 400 mL PEEP:  [5 cmH20] 5 cmH20 Plateau Pressure:  [8 cmH20-26 cmH20] 15 cmH20   Intake/Output Summary (Last 24 hours) at 04/27/2020 0930 Last data filed at 04/27/2020 0800 Gross per 24 hour  Intake 3450.2 ml  Output 750 ml  Net 2700.2 ml   Filed Weights   04/18/2020 0357 04/26/20 0500  Weight: 52.2 kg 64.1 kg    Examination: General this is a 59 year old white female she remains on full ventilatory support, still requiring fairly high dose norepinephrine and fixed shock dose vasopressin she is currently denying chest pain or discomfort on current mode of ventilation HEENT: Normocephalic with the exception of temporal wasting, mucous membranes moist.  Sclera nonicteric.  No neck vein distention appreciated, right IJ triple-lumen catheter unremarkable.  Orally intubated. Pulmonary: Diffuse scattered rhonchi with equal chest rise.  Currently on FiO2 of 30%, PEEP: Of 5 cm water. Cardiac: Regular irregular.  Currently telemetry evaluation shows atrial fibrillation with controlled ventricular rate, however does appear to be in and out of normal sinus Extremities: Weeping extremities with scattered areas of ecchymosis,  does have significant pitting edema in the sacrum and abdomen.  Her pulses are weak however palpable, capillary refill is  brisk GU: Concentrated yellow urine Neuro awake, will follow some commands but profoundly weak no focal motor deficits appreciated Abdomen distended, firm to palpation, anasarca as noted above, tolerating tube feeds.   Resolved Hospital Problem list    Liver mass -CT showed hypodense liver masses -Abdominal MRI neg  acute kidney injury Assessment & Plan:    Postop acute hypoxic respiratory failure with diffuse bilateral infiltrates favoring aspiration pneumonia Intubated post EGD  -Portable chest x-ray personally reviewed: This demonstrates the endotracheal tubes in satisfactory position the left internal jugular vein catheter is unremarkable, aeration comparing to film on 8/3 looks worse.  There is worsening left-sided and right-sided airspace disease I think this is more consistent with pulmonary edema -No fever spike, white blood cell count remains elevated however she is on hydrocortisone -She is currently 10 L positive Plan Continue full ventilator support  Daily assessment for spontaneous breathing trial and attempts at pressure support ventilation  PAD protocol with RASS goal zero to -1  Day #4 Unasyn, still awaiting culture sensitivities VAP bundle  Ideally we need to diurese her, however her blood pressure is preventing that at this time  Septic shock likely due to aspiration pneumonia , no evidence of SBP -Still on 2 pressors, her CVP suggest she is euvolemic, there is no active bleeding currently, hemoglobin is staying stable.  Third spacing and low oncotic pressure remained an issue she did receive albumin yesterday, also started her on Solu-Cortef, her cortisol is over 100 which I find difficult to believe I suspect this was drawn after administration of her Solu-Cortef Plan Continue stress dose steroids  Keeping euvolemic  Continue midodrine  Titrate norepinephrine for systolic blood pressure goal of greater than ninety, once we have her down to 10 mcg/min of  norepinephrine will discontinue vasopressin  Checking echocardiogram    New atrial fibrillation with rapid ventricular response, also ST changes in anterior leads -Not a candidate for anticoagulation given recent life-threatening bleeding, nor is she a candidate for left heart cath Plan Continue telemetry monitoring Rate control with amiodarone Cycling troponin, obtaining echocardiogram Hemodynamics preclude use of beta-blockade   Acute metabolic encephalopathy: This looks a little better today, she did have active history of EtOH, I think this is a mix of both toxic and metabolic encephalopathy as well as hepatic encephalopathy Plan Continue supportive care  RASS goal zero to -1  Completing Ativan taper  Thiamine and folate  Lactulose    Anion gap metabolic acidosis: Cirrhosis will make lactic acid clearance slow Plan Keep euvolemic Check arterial blood gas  Fluid and electrolyte balance: Hyponatremia, acute on chronic-sodium was noted to be 127 in 02/2020, 123 on admit Hypokalemia Plan Keep euvolemic Replace potassium   UGI bleed with acute blood loss anemia-severe esophagitis on EGD; hemoglobin has drifted, see above Plan Continue twice daily PPI No anticoagulation Advance tube feeds   EtOH cirrhosis with ascites -Transudative, status post paracentesis Plan Intermittent LFTs Will need GI follow-up    Best practice:  Diet: npo-->tubefeeds  Pain/Anxiety/Delirium protocol (if indicated): Precedex/fent, goal RASS -1 VAP protocol (if indicated): y DVT prophylaxis: SCDs GI prophylaxis: protonix  Glucose control: SSI Mobility: BR Code Status: full Family Communication: spouse, [name] Disposition: ICU   Critical care time: 35 minutes     Erick Colace ACNP-BC [place] Pager # [phone] OR # [phone] if no answer  04/27/2020

## 2020-04-27 NOTE — Progress Notes (Signed)
Pharmacy Antibiotic Note  Melissa Kent is a 59 y.o. female with a h/o ETOH cirrhosis admitted on 04/18/2020 with coffee-ground emesis, hyponatremia, and AKI.  Pharmacy has been  consulted for Unasyn and vancomycin dosing for septic shock likely related to aspiration PNA.   Day 4 Abxs WBC 23.3 rising, pt on stress dose steroids SCr stable Cx's no growth to date  Plan: Continue Unasyn 3g IV q6 per current renal function F/U renal function, culture results, and clinical course.   Height: 5\' 2"  (157.5 cm) Weight: 64.1 kg (141 lb 5 oz) IBW/kg (Calculated) : 50.1  Temp (24hrs), Avg:97.5 F (36.4 C), Min:97.3 F (36.3 C), Max:97.8 F (36.6 C)  Recent Labs  Lab 04/17/2020 0920 04/15/2020 1816 04/25/20 0519 04/25/20 0846 04/25/20 1134 04/25/20 1205 04/25/20 1430 04/25/20 1715 04/26/20 0520 04/26/20 1706 04/27/20 0515  WBC  --    < > 18.8*  --   --  22.5*  --  17.9* 21.7* 23.3*  --   CREATININE 1.96*   < > 0.85   < > 1.32*  --   --  1.20* 1.03* 1.00 0.98  LATICACIDVEN 1.6  --   --   --  2.2*  --  2.0*  --   --   --   --    < > = values in this interval not displayed.    Estimated Creatinine Clearance: 53.7 mL/min (by C-G formula based on SCr of 0.98 mg/dL).    No Known Allergies  Antimicrobials this admission: 7/31 CTX x 1 8/1 Unasyn >>  8/1 vancomycin >> 8/1  Dose adjustments this admission:  Microbiology results: 8/1 BCx: ngtd 7/31 peritoneal fluid: ngf 8/1 TA:  Re-incubated or better growth 7/31 MRSA PCR: neg  Thank you for allowing pharmacy to be a part of this patients care.  Kara Mead 04/27/2020 10:16 AM

## 2020-04-27 NOTE — Progress Notes (Signed)
Inpatient Diabetes Program Recommendations  AACE/ADA: New Consensus Statement on Inpatient Glycemic Control (2015)  Target Ranges:  Prepandial:   less than 140 mg/dL      Peak postprandial:   less than 180 mg/dL (1-2 hours)      Critically ill patients:  140 - 180 mg/dL   Lab Results  Component Value Date   GLUCAP 195 (H) 04/27/2020    Review of Glycemic Control Results for Melissa Kent, Melissa Kent (MRN [id number]) as of 04/27/2020 09:43  Ref. Range 04/26/2020 16:59 04/26/2020 19:30 04/26/2020 23:23 04/27/2020 03:34 04/27/2020 07:40  Glucose-Capillary Latest Ref Range: 70 - 99 mg/dL 217 (H) 211 (H) 207 (H) 194 (H) 195 (H)   Diabetes history: none  Current orders for Inpatient glycemic control: none  Solucortef 50 mg Q6 hours Osmolite 45 ml/hour  Inpatient Diabetes Program Recommendations:    -Consider ICU Glycemic Control phase 1 SQ insulin 2-6 units Q4 hours.  Thanks,  Tama Headings RN, MSN, BC-ADM Inpatient Diabetes Coordinator Team Pager [phone] (8a-5p)

## 2020-04-27 NOTE — Progress Notes (Signed)
Spoke to son Melissa Kent, he and his brother are both out of state and there is concern that information has not been adequately passed to them via their mom's husband. Erlene Quan says that his stepfather is very emotional when they ask about mothers condition and he was not aware of how severe her situation is. I read over a few notes from the critical care team and passed on as much information as I could gather to him. The plan is for him and his brother to visit next week, but according to the notes it sounds like they may withdraw care on Friday. I have [place] numbers and I am going to pass them onto the day shift nurse in hopes that critical care can speak to them about the need to potentially come visit sooner.

## 2020-04-28 LAB — CBC
HCT: 26.3 % — ABNORMAL LOW (ref 36.0–46.0)
Hemoglobin: 8.8 g/dL — ABNORMAL LOW (ref 12.0–15.0)
MCH: 29.1 pg (ref 26.0–34.0)
MCHC: 33.5 g/dL (ref 30.0–36.0)
MCV: 87.1 fL (ref 80.0–100.0)
Platelets: 140 10*3/uL — ABNORMAL LOW (ref 150–400)
RBC: 3.02 MIL/uL — ABNORMAL LOW (ref 3.87–5.11)
RDW: 17.4 % — ABNORMAL HIGH (ref 11.5–15.5)
WBC: 24.2 10*3/uL — ABNORMAL HIGH (ref 4.0–10.5)
nRBC: 0 % (ref 0.0–0.2)

## 2020-04-28 LAB — GLUCOSE, CAPILLARY
Glucose-Capillary: 193 mg/dL — ABNORMAL HIGH (ref 70–99)
Glucose-Capillary: 192 mg/dL — ABNORMAL HIGH (ref 70–99)
Glucose-Capillary: 188 mg/dL — ABNORMAL HIGH (ref 70–99)
Glucose-Capillary: 198 mg/dL — ABNORMAL HIGH (ref 70–99)
Glucose-Capillary: 135 mg/dL — ABNORMAL HIGH (ref 70–99)

## 2020-04-28 LAB — BASIC METABOLIC PANEL
Anion gap: 11 (ref 5–15)
BUN: 25 mg/dL — ABNORMAL HIGH (ref 6–20)
CO2: 19 mmol/L — ABNORMAL LOW (ref 22–32)
Calcium: 9 mg/dL (ref 8.9–10.3)
Chloride: 111 mmol/L (ref 98–111)
Creatinine, Ser: 0.91 mg/dL (ref 0.44–1.00)
GFR calc Af Amer: >60 mL/min (ref >60)
GFR calc non Af Amer: >60 mL/min (ref >60)
Glucose, Bld: 214 mg/dL — ABNORMAL HIGH (ref 70–99)
Potassium: 3.2 mmol/L — ABNORMAL LOW (ref 3.5–5.1)
Sodium: 141 mmol/L (ref 135–145)

## 2020-04-28 LAB — BRAIN NATRIURETIC PEPTIDE: B Natriuretic Peptide: >4500 pg/mL — ABNORMAL HIGH (ref 0.0–100.0)

## 2020-04-28 MED ORDER — ONDANSETRON HCL 4 MG/2ML IJ SOLN
4.0000 mg | Freq: Once | INTRAMUSCULAR | Status: AC
  Administered 2020-04-28: 4 mg via INTRAVENOUS
  Filled 2020-04-28: qty 2

## 2020-04-28 MED ORDER — FOLIC ACID 1 MG PO TABS
1.0000 mg | ORAL_TABLET | Freq: Every day | ORAL | Status: DC
  Administered 2020-04-29 – 2020-05-04 (×6): 1 mg
  Filled 2020-04-28 (×6): qty 1

## 2020-04-28 MED ORDER — FENTANYL CITRATE (PF) 100 MCG/2ML IJ SOLN
25.0000 ug | INTRAMUSCULAR | Status: AC | PRN
  Administered 2020-04-28 – 2020-04-29 (×3): 25 ug via INTRAVENOUS
  Filled 2020-04-28 (×2): qty 2

## 2020-04-28 MED ORDER — INSULIN ASPART 100 UNIT/ML ~~LOC~~ SOLN
0.0000 [IU] | SUBCUTANEOUS | Status: DC
  Administered 2020-04-28: 2 [IU] via SUBCUTANEOUS
  Administered 2020-04-28: 1 [IU] via SUBCUTANEOUS
  Administered 2020-04-29 (×2): 2 [IU] via SUBCUTANEOUS
  Administered 2020-04-29: 1 [IU] via SUBCUTANEOUS
  Administered 2020-04-29 (×2): 2 [IU] via SUBCUTANEOUS
  Administered 2020-04-29 – 2020-04-30 (×7): 1 [IU] via SUBCUTANEOUS
  Administered 2020-05-01 – 2020-05-02 (×7): 2 [IU] via SUBCUTANEOUS
  Administered 2020-05-02: 1 [IU] via SUBCUTANEOUS
  Administered 2020-05-02 – 2020-05-03 (×5): 2 [IU] via SUBCUTANEOUS
  Administered 2020-05-03: 1 [IU] via SUBCUTANEOUS
  Administered 2020-05-03 (×4): 2 [IU] via SUBCUTANEOUS
  Administered 2020-05-04 – 2020-05-05 (×6): 1 [IU] via SUBCUTANEOUS

## 2020-04-28 MED ORDER — MIDODRINE HCL 5 MG PO TABS
10.0000 mg | ORAL_TABLET | Freq: Three times a day (TID) | ORAL | Status: DC
  Administered 2020-04-28 – 2020-05-05 (×19): 10 mg
  Filled 2020-04-28 (×20): qty 2

## 2020-04-28 MED ORDER — FENTANYL CITRATE (PF) 100 MCG/2ML IJ SOLN
25.0000 ug | INTRAMUSCULAR | Status: DC | PRN
  Administered 2020-04-28 – 2020-04-29 (×4): 25 ug via INTRAVENOUS
  Filled 2020-04-28 (×3): qty 2

## 2020-04-28 MED ORDER — FUROSEMIDE 10 MG/ML IJ SOLN
40.0000 mg | Freq: Once | INTRAMUSCULAR | Status: AC
  Administered 2020-04-28: 40 mg via INTRAVENOUS
  Filled 2020-04-28: qty 4

## 2020-04-28 NOTE — TOC Progression Note (Signed)
Transition of Care [facility]) - Progression Note    Patient Details  Name: Melissa Kent MRN: [id number] Date of Birth: [birth year]  Transition of [facility]) CM/SW Contact  Leeroy Cha, RN Phone Number: 04/28/2020, 8:14 AM  Clinical Narrative:    On vent and pressure support, iv abx, iv sedation, bnp> 4500.0, wbc 24.2.  Possible withdrawal of care on [id number].  Husband is aware. Sons are out of town and do not plan to visit until next week. Husband is in communication with them. Following for progression and toc needs.   Expected Discharge Plan: Home/Self Care Barriers to Discharge: Continued Medical Work up  Expected Discharge Plan and Services Expected Discharge Plan: Home/Self Care   Discharge Planning Services: CM Consult   Living arrangements for the past 2 months: Apartment                                       Social Determinants of Health (SDOH) Interventions    Readmission Risk Interventions No flowsheet data found.

## 2020-04-28 NOTE — Progress Notes (Signed)
Inpatient Diabetes Program Recommendations  AACE/ADA: New Consensus Statement on Inpatient Glycemic Control (2015)  Target Ranges:  Prepandial:   less than 140 mg/dL      Peak postprandial:   less than 180 mg/dL (1-2 hours)      Critically ill patients:  140 - 180 mg/dL   Lab Results  Component Value Date   GLUCAP 192 (H) 04/28/2020    Review of Glycemic Control Results for Melissa Kent, Melissa Kent (MRN [id number]) as of 04/28/2020 10:21  Ref. Range 04/27/2020 07:40 04/27/2020 13:45 04/27/2020 16:19 04/27/2020 19:38 04/27/2020 23:39 04/28/2020 03:17 04/28/2020 07:39  Glucose-Capillary Latest Ref Range: 70 - 99 mg/dL 195 (H) 208 (H) 187 (H) 195 (H) 193 (H) 193 (H) 192 (H)    Diabetes history: none  Current orders for Inpatient glycemic control: none  Solucortef 50 mg Q6 hours Osmolite 45 ml/hour  Inpatient Diabetes Program Recommendations:    -Consider ICU Glycemic Control phase 1 SQ insulin 2-6 units Q4 hours.  Thanks,  Tama Headings RN, MSN, BC-ADM Inpatient Diabetes Coordinator Team Pager [phone] (8a-5p)

## 2020-04-28 NOTE — Progress Notes (Addendum)
NAME:  Keiyana Stehr, MRN:  [id number], DOB:  [birth year], LOS: 5 ADMISSION DATE:  04/21/2020, CONSULTATION DATE:  04/28/2020  REFERRING MD:  Benny Lennert, [facility] COMPLAINT: Respiratory distress post EGD  Brief History   59 year old with EtOH cirrhosis and ascites admitted with coffee-ground emesis, drop in hemoglobin, hyponatremia and AKI, underwent EGD which showed severe esophagitis , required intubation in PACU postprocedure. Developed progressive shock post intubation/sedation   Past Medical History  EtOH cirrhosis, ascites Leukocytosis  Significant Hospital Events   7/31 EGD showed esophagitis. Also had paracentesis removed 4 liters. Intubated in PACU d/t shock state resp  8/1: Breakthrough agitation on low-dose Precedex and intermittent fentanyl, CVP remains low 8/2: worsening shock over last 24 hrs. On levophed, vasopressin and phenylephrine. CVP-->still low. hgb 7.4 ->2 units total on 7/31 and 8/1, peaked at 9.2-->down again to 7.8-->transfusing 3rd unit for active shock  8/3: Vasoactive drip requirements improved, awake, following commands, still fairly tachypneic but chest x-ray improved off phenylephrine, still on norepinephrine as well as vasopressin, developed new atrial fibrillation with rapid ventricular response and was placed on amiodarone infusion 8/4: Clinically looks about the same.  Still on vasoactive drips.  Not able to wean much.  Developed some ST changes in anterior leads, denied chest pain, had no hemodynamic changes, no worsening work of breathing 8/5 off vasopressin. Levophed req better. ECHO w/ nml EF but gd II diastolic dysfxn. Giving lasix. Stopping fent gtt.  Consults:  GI  Procedures:  EGD 7/31 severe esophagitis, no varices 7/31 4 L paracentesis ETT 7/31 >> LIJ 7/31 >>   Significant Diagnostic Tests:  CT renal stone study 7/30 >> small bilateral effusions, cirrhosis, small hiatal hernia, large ill-defined hypodense liver masses, large ascites RUQ  ultrasound 7/31 >> large ascites, liver mass not well visualized MRI abdomen 7/31 >> no clear mass identified Echocardiogram 8/4>>>Left ventricular ejection fraction, by estimation, is 55 to 60%. The left ventricle has normal function. The left ventricle has no regional wall motion abnormalities. Left ventricular diastolic parameters are  consistent with Grade II diastolic dysfunction (pseudonormalization). Elevated left atrial pressure.  2. Right ventricular systolic function is normal. The right ventricular size is normal. There is normal pulmonary artery systolic pressure. 3. Left atrial size was severely dilated. 4. Right atrial size was moderately dilated.  5. The mitral valve is grossly normal. Moderate to severe mitral valve regurgitation. No evidence of mitral stenosis.  6. Tricuspid valve regurgitation is severe. 7. The aortic valve is tricuspid. Aortic valve regurgitation is mild to  moderate. Mild aortic valve sclerosis is present, with no evidence of aortic valve stenosis.  Micro Data:  Blood 8/1 >> Ascitic fluid 7/31 >>ng Sputum culture 8/1: Rare EIKENELLA CORRODENS typically sens to PCN) Antimicrobials:  ceftx 7/31 >> 8/1 Unasyn 8/1>>>  Interim history/subjective:  Off vasopressin.    Objective   Blood pressure 103/76, pulse 83, temperature (Abnormal) 97.5 F (36.4 C), temperature source Oral, resp. rate 20, height 5\' 2"  (1.575 m), weight 66.6 kg, SpO2 99 %. CVP:  [11 mmHg-18 mmHg] 11 mmHg  Vent Mode: PRVC FiO2 (%):  [30 %] 30 % Set Rate:  [20 bmp] 20 bmp Vt Set:  [400 mL] 400 mL PEEP:  [5 cmH20] 5 cmH20 Plateau Pressure:  [15 cmH20-19 cmH20] 17 cmH20   Intake/Output Summary (Last 24 hours) at 04/28/2020 1012 Last data filed at 04/28/2020 0800 Gross per 24 hour  Intake 2484.5 ml  Output 565 ml  Net 1919.5 ml   [employer]  04/12/2020 0357 04/26/20 0500 04/28/20 0500  Weight: 52.2 kg 64.1 kg 66.6 kg    Examination: General: Is chronically ill 59 year old  white female she is currently on full ventilator support, she was fairly heavily sedated this morning, I have since turned her fentanyl infusion off HEENT normocephalic with the exception of significant temporal wasting sclera nonicteric orally intubated no JVD Pulmonary: Coarse scattered rhonchi bilaterally remains on 30% FiO2/PEEP 5.  Unable to trigger spontaneous ventilation given sedated status, triggering apnea alarm on my exam this morning Cardiac: Continues to be in atrial fibrillation with controlled ventricular rate currently no murmur rub or gallop Abdomen: Little more distended.  Does have tympanic percussion with mild shifting dullness.  Having liquid stool today, this is been persistent for the last 24 hours of note she is on lactulose GU: Clear yellow Neuro: Currently heavily sedated Extremities: Warm, weeping, scattered areas of ecchymosis, diffuse anasarca noted, particularly having significant pitting edema in sacrum   Resolved Hospital Problem list    Liver mass -CT showed hypodense liver masses -Abdominal MRI neg  acute kidney injury Anion gap metabolic acidosis   Assessment & Plan:    Postop acute hypoxic respiratory failure with diffuse bilateral infiltrates favoring aspiration pneumonia (EIKENELLA CORRODENS grew from cultures)   Intubated post EGD  -Remains on full ventilator support, has not required escalation of PEEP/FiO2 she is 13 L positive Plan IV Lasix today  Continue PAD protocol RASS goal 0-1  Reattempt pressure support ventilation later this morning after diuresis and fentanyl having had time to wear off day 5/7 unasyn VAP bundle Am cxr  Septic shock likely due to aspiration pneumonia , no evidence of SBP -pressor requirement improving  Plan Day 3/5 stress dose steroids Dc vasopressin Cont norepi for SBP goal > 90 Cont midodrine  Cont tele euvolemia goal abx as above  New atrial fibrillation with rapid ventricular response, also ST changes in  anterior leads Diastolic heart failure  -Not a candidate for anticoagulation given recent life-threatening bleeding, nor is she a candidate for left heart cath -echo w/out evidence of WM abnormality. Did have grade II diastolic dysfxn but systolic fxn intact  Plan Cont tele  Amiodarone gtt; may stop this in next 24 hrs Not candidate for [facility] Lasix x1   Acute metabolic encephalopathy: This looks a little better today, she did have active history of EtOH, I think this is a mix of both toxic and metabolic encephalopathy as well as hepatic encephalopathy Plan RASS goal 0 to -1 Cont thiamine and folate Ativan taper  Lactulose    Fluid and electrolyte balance: Hyponatremia, acute on chronic-sodium was noted to be 127 in 02/2020, 123 on admit Hypokalemia Plan Awaiting chemistry   UGI bleed with acute blood loss anemia-severe esophagitis on EGD; hemoglobin has drifted, see above Plan Cont ppi bid  Tube feeds   EtOH cirrhosis with ascites -Transudative, status post paracentesis Plan Intermittent LFTs Needs to f/u w/ GI     Best practice:  Diet: npo-->tubefeeds  Pain/Anxiety/Delirium protocol (if indicated): Precedex/fent, goal RASS -1 VAP protocol (if indicated): y DVT prophylaxis: SCDs GI prophylaxis: protonix  Glucose control: SSI Mobility: BR Code Status: full Family Communication: spouse, [name] Disposition: ICU   Critical care time: 35 minutes     Erick Colace ACNP-BC [place] Pager # [phone] OR # [phone] if no answer  04/28/2020

## 2020-04-28 NOTE — Progress Notes (Signed)
Patient vomited during linen change. This RN repositioned patient, orally suctioned with hard [place]. Suctioned ET Tube, see flowsheet. Notified Dr. Lake Bells of these findings. Received orders for ILWS and x1 dose of zofran. Patient in no distress.

## 2020-04-28 NOTE — Progress Notes (Signed)
Returned to [facility] due to increased RR of 43-45 on PS. RN at bedside at this time. RT will continue to monitor

## 2020-04-28 NOTE — Progress Notes (Signed)
eLink Physician-Brief Progress Note Patient Name: Melissa Kent DOB: Mar 18, 1961 MRN: [id number]   Date of Service  04/28/2020  HPI/Events of Note  BMP requested   eICU Interventions  Order placed     Intervention Category Minor Interventions: Clinical assessment - ordering diagnostic tests  Margaretmary Lombard 04/28/2020, 4:52 AM

## 2020-04-29 ENCOUNTER — Inpatient Hospital Stay (HOSPITAL_COMMUNITY): Payer: BC Managed Care – PPO

## 2020-04-29 DIAGNOSIS — J690 Pneumonitis due to inhalation of food and vomit: Secondary | ICD-10-CM

## 2020-04-29 LAB — CBC
HCT: 28.6 % — ABNORMAL LOW (ref 36.0–46.0)
Hemoglobin: 9.5 g/dL — ABNORMAL LOW (ref 12.0–15.0)
MCH: 29.1 pg (ref 26.0–34.0)
MCHC: 33.2 g/dL (ref 30.0–36.0)
MCV: 87.7 fL (ref 80.0–100.0)
Platelets: 96 10*3/uL — ABNORMAL LOW (ref 150–400)
RBC: 3.26 MIL/uL — ABNORMAL LOW (ref 3.87–5.11)
RDW: 17.5 % — ABNORMAL HIGH (ref 11.5–15.5)
WBC: 24.5 10*3/uL — ABNORMAL HIGH (ref 4.0–10.5)
nRBC: 0 % (ref 0.0–0.2)

## 2020-04-29 LAB — GLUCOSE, CAPILLARY
Glucose-Capillary: 121 mg/dL — ABNORMAL HIGH (ref 70–99)
Glucose-Capillary: 134 mg/dL — ABNORMAL HIGH (ref 70–99)
Glucose-Capillary: 141 mg/dL — ABNORMAL HIGH (ref 70–99)
Glucose-Capillary: 154 mg/dL — ABNORMAL HIGH (ref 70–99)
Glucose-Capillary: 156 mg/dL — ABNORMAL HIGH (ref 70–99)
Glucose-Capillary: 158 mg/dL — ABNORMAL HIGH (ref 70–99)
Glucose-Capillary: 174 mg/dL — ABNORMAL HIGH (ref 70–99)

## 2020-04-29 LAB — CULTURE, BLOOD (ROUTINE X 2): Culture: NO GROWTH

## 2020-04-29 LAB — COMPREHENSIVE METABOLIC PANEL
ALT: 13 U/L (ref 0–44)
AST: 21 U/L (ref 15–41)
Albumin: 2.6 g/dL — ABNORMAL LOW (ref 3.5–5.0)
Alkaline Phosphatase: 134 U/L — ABNORMAL HIGH (ref 38–126)
Anion gap: 11 (ref 5–15)
BUN: 27 mg/dL — ABNORMAL HIGH (ref 6–20)
CO2: 20 mmol/L — ABNORMAL LOW (ref 22–32)
Calcium: 9.4 mg/dL (ref 8.9–10.3)
Chloride: 110 mmol/L (ref 98–111)
Creatinine, Ser: 0.97 mg/dL (ref 0.44–1.00)
GFR calc Af Amer: >60 mL/min (ref >60)
GFR calc non Af Amer: >60 mL/min (ref >60)
Glucose, Bld: 171 mg/dL — ABNORMAL HIGH (ref 70–99)
Potassium: 2.9 mmol/L — ABNORMAL LOW (ref 3.5–5.1)
Sodium: 141 mmol/L (ref 135–145)
Total Bilirubin: 1.4 mg/dL — ABNORMAL HIGH (ref 0.3–1.2)
Total Protein: 5.2 g/dL — ABNORMAL LOW (ref 6.5–8.1)

## 2020-04-29 LAB — HEMOGLOBIN A1C
Hgb A1c MFr Bld: 5 % (ref 4.8–5.6)
Mean Plasma Glucose: 96.8 mg/dL

## 2020-04-29 LAB — BRAIN NATRIURETIC PEPTIDE: B Natriuretic Peptide: 3702.2 pg/mL — ABNORMAL HIGH (ref 0.0–100.0)

## 2020-04-29 MED ORDER — POTASSIUM CHLORIDE 20 MEQ/15ML (10%) PO SOLN
40.0000 meq | Freq: Once | ORAL | Status: AC
  Administered 2020-04-29: 40 meq
  Filled 2020-04-29: qty 30

## 2020-04-29 MED ORDER — FENTANYL CITRATE (PF) 100 MCG/2ML IJ SOLN
12.5000 ug | INTRAMUSCULAR | Status: DC | PRN
  Administered 2020-04-29 – 2020-04-30 (×6): 12.5 ug via INTRAVENOUS
  Filled 2020-04-29 (×6): qty 2

## 2020-04-29 MED ORDER — POTASSIUM CHLORIDE 20 MEQ/15ML (10%) PO SOLN
40.0000 meq | ORAL | Status: AC
  Administered 2020-04-29 (×3): 40 meq
  Filled 2020-04-29 (×3): qty 30

## 2020-04-29 MED ORDER — MAGNESIUM SULFATE 2 GM/50ML IV SOLN
2.0000 g | Freq: Once | INTRAVENOUS | Status: AC
  Administered 2020-04-29: 2 g via INTRAVENOUS
  Filled 2020-04-29: qty 50

## 2020-04-29 MED ORDER — FUROSEMIDE 10 MG/ML IJ SOLN
40.0000 mg | Freq: Once | INTRAMUSCULAR | Status: AC
  Administered 2020-04-29: 40 mg via INTRAVENOUS
  Filled 2020-04-29: qty 4

## 2020-04-29 MED FILL — Vasopressin IV Soln 20 Unit/ML (For IV Infusion): INTRAVENOUS | Qty: 1 | Status: AC

## 2020-04-29 MED FILL — Sodium Chloride IV Soln 0.9%: INTRAVENOUS | Qty: 100 | Status: AC

## 2020-04-29 NOTE — Progress Notes (Addendum)
NAME:  Melissa Kent, MRN:  [id number], DOB:  01/22/1961, LOS: 6 ADMISSION DATE:  04/21/2020, CONSULTATION DATE:  04/29/2020  REFERRING MD:  Benny Lennert, [facility] COMPLAINT: Respiratory distress post EGD  Brief History   59 year old with EtOH cirrhosis and ascites admitted with coffee-ground emesis, drop in hemoglobin, hyponatremia and AKI, underwent EGD which showed severe esophagitis , required intubation in PACU postprocedure. Developed progressive shock post intubation/sedation   Past Medical History  EtOH cirrhosis, ascites Leukocytosis  Significant Hospital Events   7/31 EGD showed esophagitis. Also had paracentesis removed 4 liters. Intubated in PACU d/t shock state resp  8/1: Breakthrough agitation on low-dose Precedex and intermittent fentanyl, CVP remains low 8/2: worsening shock over last 24 hrs. On levophed, vasopressin and phenylephrine. CVP-->still low. hgb 7.4 ->2 units total on 7/31 and 8/1, peaked at 9.2-->down again to 7.8-->transfusing 3rd unit for active shock  8/3: Vasoactive drip requirements improved, awake, following commands, still fairly tachypneic but chest x-ray improved off phenylephrine, still on norepinephrine as well as vasopressin, developed new atrial fibrillation with rapid ventricular response and was placed on amiodarone infusion 8/4: Clinically looks about the same.  Still on vasoactive drips.  Not able to wean much.  Developed some ST changes in anterior leads, denied chest pain, had no hemodynamic changes, no worsening work of breathing 8/5 off vasopressin. Levophed req better. ECHO w/ nml EF but gd II diastolic dysfxn. Giving lasix. Stopping fent gtt.  8/6: Off all pressors.  Amiodarone discontinued.  Off sedating drips.  Past spontaneous breathing trial, chest x-ray better following diuresis.  Successfully extubated. Consults:  GI  Procedures:  EGD 7/31 severe esophagitis, no varices 7/31 4 L paracentesis ETT 7/31 >>8/6 LIJ 7/31 >>   Significant  Diagnostic Tests:  CT renal stone study 7/30 >> small bilateral effusions, cirrhosis, small hiatal hernia, large ill-defined hypodense liver masses, large ascites RUQ ultrasound 7/31 >> large ascites, liver mass not well visualized MRI abdomen 7/31 >> no clear mass identified Echocardiogram 8/4>>>Left ventricular ejection fraction, by estimation, is 55 to 60%. The left ventricle has normal function. The left ventricle has no regional wall motion abnormalities. Left ventricular diastolic parameters are  consistent with Grade II diastolic dysfunction (pseudonormalization). Elevated left atrial pressure.  2. Right ventricular systolic function is normal. The right ventricular size is normal. There is normal pulmonary artery systolic pressure. 3. Left atrial size was severely dilated. 4. Right atrial size was moderately dilated.  5. The mitral valve is grossly normal. Moderate to severe mitral valve regurgitation. No evidence of mitral stenosis.  6. Tricuspid valve regurgitation is severe. 7. The aortic valve is tricuspid. Aortic valve regurgitation is mild to  moderate. Mild aortic valve sclerosis is present, with no evidence of aortic valve stenosis.  Micro Data:  Blood 8/1 >> Ascitic fluid 7/31 >>ng Sputum culture 8/1: Rare EIKENELLA CORRODENS typically sens to PCN) Antimicrobials:  ceftx 7/31 >> 8/1 Unasyn 8/1>>>  Interim history/subjective:  Passed SBT  Objective   Blood pressure 102/63, pulse 75, temperature 99 F (37.2 C), temperature source Axillary, resp. rate 20, height 5\' 2"  (1.575 m), weight 64.1 kg, SpO2 99 %. CVP:  [5 mmHg-13 mmHg] 7 mmHg  Vent Mode: PRVC FiO2 (%):  [30 %] 30 % Set Rate:  [20 bmp] 20 bmp Vt Set:  [400 mL] 400 mL PEEP:  [5 cmH20] 5 cmH20 Plateau Pressure:  [8 cmH20-14 cmH20] 13 cmH20   Intake/Output Summary (Last 24 hours) at 04/29/2020 0851 Last data filed at 04/29/2020 0800 Gross  per 24 hour  Intake 2255.68 ml  Output 2375 ml  Net -119.32 ml   Filed  Weights   04/26/20 0500 04/28/20 0500 04/29/20 0500  Weight: 64.1 kg 66.6 kg 64.1 kg    Examination:  General: This is a chronically ill-appearing 59 year old white female who is currently on spontaneous breathing trial with acceptable frequency/tidal volume ratio HEENT normocephalic atraumatic dentition is poor mucous membranes dry nasogastric tube in place endotracheal tube in place sclera nonicteric does have temporal wasting Pulmonary: Some scattered rhonchi but equal chest rise.  Tidal volumes in the 500 range on pressure support of 5/PEEP of 5 no accessory use noted Cardiac: Atrial fibrillation on telemetry no murmur rub or gallop currently with controlled ventricular rate Abdomen soft, does exhibit shifting dullness to percussion.  Tolerating tube feeds, continues to have liquid stool however a Flexi-Seal is in place.  I evaluated her via bedside ultrasound she does have moderate amount of abdominal ascites.  She continues to drain from old paracentesis site on the right lower quadrant, a colostomy bag is in place to catch this Extremities diffuse anasarca, fairly prominent in the sacrum, and lower extremities.  Has diffuse areas of ecchymosis, weeping extremities.  Pulses are palpable, capillary refill is brisk Neuro: Extremely weak, tries to interact.  Will follow commands.  No focal deficits appreciated  Resolved Hospital Problem list    Liver mass -CT showed hypodense liver masses -Abdominal MRI neg  acute kidney injury Anion gap metabolic acidosis  Septic shock (resolved and off pressors as of 8/5) Hyponatremia   Assessment & Plan:    Postop acute hypoxic respiratory failure with diffuse bilateral infiltrates favoring aspiration pneumonia (EIKENELLA CORRODENS grew from cultures)   Intubated post EGD  -volume status still > 12 liters -PCXR personally reviewed-->improved bilateral aeration  Plan Extubate IV lasix  Dc sedation  NPO-->asp precautions IS  Mobilize Wean  oxygen w/ cont pulse ox  Day 6/7 unasyn   Sepsis 2/2 aspiration pneumonia , no evidence of SBP -pressor requirement improving  Plan Day 4/5 stress dose steroids KVO Cont midodrine abx as above    New atrial fibrillation with rapid ventricular response, also ST changes in anterior leads Diastolic heart failure  -Not a candidate for anticoagulation given recent life-threatening bleeding, nor is she a candidate for left heart cath -echo w/out evidence of WM abnormality. Did have grade II diastolic dysfxn but systolic fxn intact  Plan Cont tele Dc amiodarone Not candidate for [facility] Lasix    Acute metabolic encephalopathy: This looks a little better today, she did have active history of EtOH, I think this is a mix of both toxic and metabolic encephalopathy as well as hepatic encephalopathy Plan Dc sedation  Cont ativan taper  Cont lactulose    Fluid and electrolyte balance: Hypokalemia massive volume overload/anasarca  Plan Cont to push lasix Replace K Serial chemistries   UGI bleed with acute blood loss anemia-severe esophagitis on EGD; hemoglobin has drifted, see above Plan PPI Resume tube feeds.   EtOH cirrhosis with ascites -Transudative, status post paracentesis -[place] eval at bedside 5/6: mod amt of ascites.  Plan Intermittent LFTs Will keep colostomy bag in place which is draining leaking fistula site from prior paracentesis  May need to repeat paracentesis     Best practice:  Diet: npo-->tubefeeds  Pain/Anxiety/Delirium protocol (if indicated): Precedex/fent, goal RASS -1 VAP protocol (if indicated): y DVT prophylaxis: SCDs GI prophylaxis: protonix  Glucose control: SSI Mobility: BR Code Status: full Family Communication: spouse, Delfino Lovett  Disposition: ICU   Critical care time: 45 minutes     Erick Colace ACNP-BC [place] Pager # [phone] OR # [phone] if no answer  04/29/2020

## 2020-04-29 NOTE — Progress Notes (Signed)
Nutrition Follow-up  DOCUMENTATION CODES:   Severe malnutrition in context of chronic illness  INTERVENTION:  - diet advancement as medically feasible. - if diet unable to be advanced, recommend re-start TF: Osmolite 1.2 @ 30 ml/hr to advance by 10 ml every 12 hours to reach goal rate of 60 ml/hr. - at goal rate, this regimen would provide 1728 kcal, 80 grams protein, and 1181 ml free water.   NUTRITION DIAGNOSIS:   Severe Malnutrition related to chronic illness (alcoholic cirrhosis) as evidenced by moderate fat depletion, moderate muscle depletion, severe fat depletion, severe muscle depletion. -ongoing  GOAL:   Patient will meet greater than or equal to 90% of their needs -unmet, unable to meet  MONITOR:   Diet advancement, Labs, Weight trends  ASSESSMENT:   59 year old female with medical history of alcoholic cirrhosis, ascites, DM, CHF, CKD, COPD, asthma, MI, and stroke. She was admitted with coffee-ground emesis, drop in Hgb, hyponatremia, and AKI. She underwent EGD which showed severe esophagitis and required intubation in PACU post-procedure. She developed progressive shock after intubation.  Patient was extubated today at ~0905. NGT remains in L nare. While intubated, she was receiving Osmolite 1.2 and ProSource TF. Patient remains NPO at this time. No family/visitors present.   Able to talk with RN. If patient is able to safely clear secretions, plan is to begin diet advancement.   Weight has been stable over the past 4 days. Flow sheet documentation indicates moderate pitting edema to BUE, perineal area, and sacrum and very deep pitting edema to BLE.   Per notes: - sepsis 2/2 aspiration PNA New afib with RVR - acute metabolic encephalopathy--improving - UGIB with acute blood loss anemia - s/p EGD which showed severe esophagitis - alcoholic cirrhosis with ascites    Labs reviewed; CBGs: 121, 154, 156, 174 mg/dl; K: 2.9 mmol/l; BUN: 27 mg/dl. Medications reviewed;  1 mg folvite/day, 40 mg IV lasix x1 dose 8/6, 50 mg solu-cortef QID, sliding scale novolog, 30 g lactulose BID, 2 g IV Mg sulfate x1 run 8/6, 1 tablet multivitamin with minerals/day, 40 mg IV protonix BID, 40 mEq KCl x3 doses 8/6, 100 mg IV thiamine/day.    Diet Order:   Diet Order            Diet NPO time specified  Diet effective now                 EDUCATION NEEDS:   No education needs have been identified at this time  Skin:  Skin Assessment: Reviewed RN Assessment (MASD buttocks and groin)  Last BM:  8/6  Height:   Ht Readings from Last 1 Encounters:  04/03/2020 5\' 2"  (1.575 m)    Weight:   Wt Readings from Last 1 Encounters:  04/29/20 64.1 kg    Ideal Body Weight:     BMI:  Body mass index is 25.85 kg/m.  Estimated Nutritional Needs:   Kcal:  1700-1900 kcal  Protein:  78-94 grams (1.5-1.8 grams/kg)  Fluid:  >/= 1.5 L/day     Jarome Matin, MS, RD, LDN, CNSC Inpatient Clinical Dietitian RD pager # available in AMION  After hours/weekend pager # available in [facility]

## 2020-04-29 NOTE — Procedures (Signed)
Extubation Procedure Note  Patient Details:   Name: Melissa Kent DOB: September 12, 1961 MRN: [id number]   Airway Documentation:  Airway 7 mm (Active)  Secured at (cm) 24 cm 04/29/20 0750  Measured From Lips 04/29/20 [place] 04/29/20 0750  Secured By [employer] 04/29/20 0750  Tube Holder Repositioned Yes 04/29/20 0750  Cuff Pressure (cm H2O) 26 cm H2O 04/29/20 0750  Site Condition Dry 04/29/20 0750   Vent end date: 04/29/20 Vent end time: 0905   Evaluation  O2 sats: stable throughout Complications: No apparent complications Patient did tolerate procedure well. Bilateral Breath Sounds: Diminished, Rhonchi   Yes   Patient extubated to 3 L [place] w/ O2 sats of 100%. No stridor noted.   Lamonte Sakai 04/29/2020, 9:07 AM

## 2020-04-30 ENCOUNTER — Inpatient Hospital Stay (HOSPITAL_COMMUNITY): Payer: BC Managed Care – PPO

## 2020-04-30 DIAGNOSIS — R601 Generalized edema: Secondary | ICD-10-CM

## 2020-04-30 DIAGNOSIS — R5381 Other malaise: Secondary | ICD-10-CM

## 2020-04-30 DIAGNOSIS — J9601 Acute respiratory failure with hypoxia: Secondary | ICD-10-CM | POA: Diagnosis not present

## 2020-04-30 DIAGNOSIS — I5033 Acute on chronic diastolic (congestive) heart failure: Secondary | ICD-10-CM

## 2020-04-30 DIAGNOSIS — K922 Gastrointestinal hemorrhage, unspecified: Secondary | ICD-10-CM | POA: Diagnosis not present

## 2020-04-30 DIAGNOSIS — D62 Acute posthemorrhagic anemia: Secondary | ICD-10-CM

## 2020-04-30 DIAGNOSIS — J690 Pneumonitis due to inhalation of food and vomit: Secondary | ICD-10-CM | POA: Diagnosis not present

## 2020-04-30 LAB — URINALYSIS, ROUTINE W REFLEX MICROSCOPIC
Bilirubin Urine: NEGATIVE
Glucose, UA: NEGATIVE mg/dL
Hgb urine dipstick: NEGATIVE
Ketones, ur: NEGATIVE mg/dL
Leukocytes,Ua: NEGATIVE
Nitrite: NEGATIVE
Protein, ur: NEGATIVE mg/dL
Specific Gravity, Urine: 1.011 (ref 1.005–1.030)
pH: 5 (ref 5.0–8.0)

## 2020-04-30 LAB — BRAIN NATRIURETIC PEPTIDE: B Natriuretic Peptide: 3071.7 pg/mL — ABNORMAL HIGH (ref 0.0–100.0)

## 2020-04-30 LAB — COMPREHENSIVE METABOLIC PANEL
ALT: 27 U/L (ref 0–44)
AST: 53 U/L — ABNORMAL HIGH (ref 15–41)
Albumin: 2.7 g/dL — ABNORMAL LOW (ref 3.5–5.0)
Alkaline Phosphatase: 282 U/L — ABNORMAL HIGH (ref 38–126)
Anion gap: 14 (ref 5–15)
BUN: 29 mg/dL — ABNORMAL HIGH (ref 6–20)
CO2: 18 mmol/L — ABNORMAL LOW (ref 22–32)
Calcium: 10.3 mg/dL (ref 8.9–10.3)
Chloride: 114 mmol/L — ABNORMAL HIGH (ref 98–111)
Creatinine, Ser: 0.86 mg/dL (ref 0.44–1.00)
GFR calc Af Amer: >60 mL/min (ref >60)
GFR calc non Af Amer: >60 mL/min (ref >60)
Glucose, Bld: 138 mg/dL — ABNORMAL HIGH (ref 70–99)
Potassium: 4.6 mmol/L (ref 3.5–5.1)
Sodium: 146 mmol/L — ABNORMAL HIGH (ref 135–145)
Total Bilirubin: 1.5 mg/dL — ABNORMAL HIGH (ref 0.3–1.2)
Total Protein: 5.7 g/dL — ABNORMAL LOW (ref 6.5–8.1)

## 2020-04-30 LAB — CBC
HCT: 32.7 % — ABNORMAL LOW (ref 36.0–46.0)
Hemoglobin: 10.8 g/dL — ABNORMAL LOW (ref 12.0–15.0)
MCH: 29.2 pg (ref 26.0–34.0)
MCHC: 33 g/dL (ref 30.0–36.0)
MCV: 88.4 fL (ref 80.0–100.0)
Platelets: 85 10*3/uL — ABNORMAL LOW (ref 150–400)
RBC: 3.7 MIL/uL — ABNORMAL LOW (ref 3.87–5.11)
RDW: 19 % — ABNORMAL HIGH (ref 11.5–15.5)
WBC: 26.5 10*3/uL — ABNORMAL HIGH (ref 4.0–10.5)
nRBC: 0 % (ref 0.0–0.2)

## 2020-04-30 LAB — GLUCOSE, CAPILLARY
Glucose-Capillary: 129 mg/dL — ABNORMAL HIGH (ref 70–99)
Glucose-Capillary: 131 mg/dL — ABNORMAL HIGH (ref 70–99)
Glucose-Capillary: 133 mg/dL — ABNORMAL HIGH (ref 70–99)
Glucose-Capillary: 138 mg/dL — ABNORMAL HIGH (ref 70–99)
Glucose-Capillary: 158 mg/dL — ABNORMAL HIGH (ref 70–99)
Glucose-Capillary: 166 mg/dL — ABNORMAL HIGH (ref 70–99)

## 2020-04-30 MED ORDER — GERHARDT'S BUTT CREAM
TOPICAL_CREAM | CUTANEOUS | Status: DC | PRN
  Filled 2020-04-30: qty 1

## 2020-04-30 MED ORDER — OSMOLITE 1.2 CAL PO LIQD
1000.0000 mL | ORAL | Status: AC
  Administered 2020-04-30 – 2020-05-02 (×5): 1000 mL

## 2020-04-30 MED ORDER — FUROSEMIDE 10 MG/ML IJ SOLN
40.0000 mg | Freq: Once | INTRAMUSCULAR | Status: AC
  Administered 2020-04-30: 40 mg via INTRAVENOUS
  Filled 2020-04-30: qty 4

## 2020-04-30 MED ORDER — OSMOLITE 1.2 CAL PO LIQD: 1000.0000 mL | ORAL | Status: DC

## 2020-04-30 MED ORDER — PROSOURCE TF PO LIQD
45.0000 mL | Freq: Two times a day (BID) | ORAL | Status: DC
  Administered 2020-04-30 – 2020-05-03 (×7): 45 mL
  Filled 2020-04-30 (×7): qty 45

## 2020-04-30 MED ORDER — SPIRONOLACTONE 25 MG PO TABS
50.0000 mg | ORAL_TABLET | Freq: Every day | ORAL | Status: DC
  Administered 2020-04-30: 50 mg
  Filled 2020-04-30: qty 2

## 2020-04-30 MED ORDER — HYDROMORPHONE HCL 1 MG/ML IJ SOLN
0.5000 mg | INTRAMUSCULAR | Status: DC | PRN
  Administered 2020-04-30 – 2020-05-04 (×6): 0.5 mg via INTRAVENOUS
  Filled 2020-04-30 (×6): qty 1

## 2020-04-30 NOTE — Evaluation (Signed)
SLP Cancellation Note  Patient Details Name: Melissa Kent MRN: [id number] DOB: 04/07/1961   Cancelled treatment:       Reason Eval/Treat Not Completed: Other (comment) (swallow eval order received, pt intubated 7/31-8/6 and currently has large bore feeding tube in place for nutrition, is grossly weak and does not phonate, will follow up on 8/9 am for swallow evaluation, thanks for the order)  Kathleen Lime, MS [place]  Macario Golds 04/30/2020, 3:47 PM

## 2020-04-30 NOTE — Progress Notes (Signed)
Nutrition Follow-up  DOCUMENTATION CODES:   Severe malnutrition in context of chronic illness  INTERVENTION:   -Osmolite 1.2 @ 30 ml/hr to advance by 10 ml every 12 hours to reach goal rate of 60 ml/hr. -45 ml Prosource BID, each provides 40 kcals and 11g protein - at goal rate, this regimen would provide 1808 kcal, 101 grams protein, and 1181 ml free water.  -Recommend free water flushes of 200 ml BID (400 ml)  NUTRITION DIAGNOSIS:   Severe Malnutrition related to chronic illness (alcoholic cirrhosis) as evidenced by moderate fat depletion, moderate muscle depletion, severe fat depletion, severe muscle depletion.  Ongoing.  GOAL:   Patient will meet greater than or equal to 90% of their needs  Progressing with TF.  MONITOR:   Diet advancement, Labs, Weight trends, TF  REASON FOR ASSESSMENT:   Consult Enteral/tube feeding initiation and management  ASSESSMENT:   59 year old female with medical history of alcoholic cirrhosis, ascites, DM, CHF, CKD, COPD, asthma, MI, and stroke. She was admitted with coffee-ground emesis, drop in Hgb, hyponatremia, and AKI. She underwent EGD which showed severe esophagitis and required intubation in PACU post-procedure. She developed progressive shock after intubation.  7/31: admitted, intubated 8/6: extubated  Per MD note, pt off pressors. Pt unable to have diet advanced. TF to restart today via NGT.   I/Os: +10.7L since admit UOP: 1.1L x 24 hrs Colostomy: 625 ml x 24 hrs Rectal tube: 400 ml x 24 hrs  Medications: Folic acid, IV Lasix, Lactulose, Multivitamin with minerals daily, Thiamine  Labs reviewed: CBGs: 133 Elevated Na  Diet Order:   Diet Order            Diet NPO time specified  Diet effective now                 EDUCATION NEEDS:   No education needs have been identified at this time  Skin:  Skin Assessment: Reviewed RN Assessment (MASD buttocks and groin)  Last BM:  8/6  Height:   Ht Readings from  Last 1 Encounters:  04/03/2020 5\' 2"  (1.575 m)    Weight:   Wt Readings from Last 1 Encounters:  04/30/20 66.2 kg   BMI:  Body mass index is 26.69 kg/m.  Estimated Nutritional Needs:   Kcal:  1700-1900 kcal  Protein:  78-94 grams (1.5-1.8 grams/kg)  Fluid:  >/= 1.5 L/day  Clayton Bibles, MS, RD, LDN Inpatient Clinical Dietitian Contact information available via Amion

## 2020-04-30 NOTE — Plan of Care (Signed)
Spoke to Dr. Roger Shelter with [facility], who will assume her care tomorrow morning.  Julian Hy, DO 04/30/20 1:58 PM [facility]

## 2020-04-30 NOTE — Progress Notes (Signed)
NAME:  Makyla Bye, MRN:  [id number], DOB:  1961-07-04, LOS: 7 ADMISSION DATE:  04/11/2020, CONSULTATION DATE:  04/30/2020  REFERRING MD:  Benny Lennert, [facility] COMPLAINT: Respiratory distress post EGD  Brief History   59 year old with EtOH cirrhosis and ascites admitted with coffee-ground emesis, drop in hemoglobin, hyponatremia and AKI, underwent EGD which showed severe esophagitis , required intubation in PACU postprocedure. Developed progressive shock post intubation/sedation   Past Medical History  EtOH cirrhosis, ascites Leukocytosis  Significant Hospital Events   7/31 EGD showed esophagitis. Also had paracentesis removed 4 liters. Intubated in PACU d/t shock state resp  8/1: Breakthrough agitation on low-dose Precedex and intermittent fentanyl, CVP remains low 8/2: worsening shock over last 24 hrs. On levophed, vasopressin and phenylephrine. CVP-->still low. hgb 7.4 ->2 units total on 7/31 and 8/1, peaked at 9.2-->down again to 7.8-->transfusing 3rd unit for active shock  8/3: Vasoactive drip requirements improved, awake, following commands, still fairly tachypneic but chest x-ray improved off phenylephrine, still on norepinephrine as well as vasopressin, developed new atrial fibrillation with rapid ventricular response and was placed on amiodarone infusion 8/4: Clinically looks about the same.  Still on vasoactive drips.  Not able to wean much.  Developed some ST changes in anterior leads, denied chest pain, had no hemodynamic changes, no worsening work of breathing 8/5 off vasopressin. Levophed req better. ECHO w/ nml EF but gd II diastolic dysfxn. Giving lasix. Stopping fent gtt.  8/6: Off all pressors.  Amiodarone discontinued.  Off sedating drips.  Past spontaneous breathing trial, chest x-ray better following diuresis.  Successfully extubated. Consults:  GI PCCM  Procedures:  EGD 7/31 severe esophagitis, no varices 7/31 4 L paracentesis ETT 7/31 >>8/6 LIJ 7/31  >>8/7   Significant Diagnostic Tests:  CT renal stone study 7/30 >> small bilateral effusions, cirrhosis, small hiatal hernia, large ill-defined hypodense liver masses, large ascites RUQ ultrasound 7/31 >> large ascites, liver mass not well visualized MRI abdomen 7/31 >> no clear mass identified Echocardiogram 8/4>>>Left ventricular ejection fraction, by estimation, is 55 to 60%. The left ventricle has normal function. The left ventricle has no regional wall motion abnormalities. Left ventricular diastolic parameters are  consistent with Grade II diastolic dysfunction (pseudonormalization). Elevated left atrial pressure.  2. Right ventricular systolic function is normal. The right ventricular size is normal. There is normal pulmonary artery systolic pressure. 3. Left atrial size was severely dilated. 4. Right atrial size was moderately dilated.  5. The mitral valve is grossly normal. Moderate to severe mitral valve regurgitation. No evidence of mitral stenosis.  6. Tricuspid valve regurgitation is severe. 7. The aortic valve is tricuspid. Aortic valve regurgitation is mild to  moderate. Mild aortic valve sclerosis is present, with no evidence of aortic valve stenosis.  Micro Data:  Blood 8/1 >> Ascitic fluid 7/31 >>ng Sputum culture 8/1: Rare EIKENELLA CORRODENS typically sens to PCN Antimicrobials:  ceftx 7/31 >> 8/1 Unasyn 8/1>>>  Interim history/subjective:  Passed SBT  Objective   Blood pressure 115/72, pulse 100, temperature 98 F (36.7 C), temperature source Axillary, resp. rate (!) 28, height 5\' 2"  (1.575 m), weight 66.2 kg, SpO2 96 %. CVP:  [7 mmHg] 7 mmHg  Vent Mode: PRVC FiO2 (%):  [30 %] 30 % Set Rate:  [20 bmp] 20 bmp Vt Set:  [400 mL] 400 mL PEEP:  [5 cmH20] 5 cmH20 Plateau Pressure:  [13 cmH20] 13 cmH20   Intake/Output Summary (Last 24 hours) at 04/30/2020 0731 Last data filed at 04/30/2020 0542 Gross  per 24 hour  Intake 714.57 ml  Output 2975 ml  Net -2260.43  ml   Filed Weights   04/28/20 0500 04/29/20 0500 04/30/20 0500  Weight: 66.6 kg 64.1 kg 66.2 kg    Examination:  General: Chronically ill, frail appearing woman lying in bed no acute distress HEENT: Temporal wasting, NGT in place Pulmonary: Breathing comfortably on nasal cannula Cardiac: Tachycardic, regular rhythm Abdomen: Soft, nondistended Extremities: Pitting dependent edema in lower extremities Neuro: Globally very weak, barely able to lift her arms off the bed.  Comprehension appears intact, attempting to answer yes and no questions.  Tracking to verbal stimulation. Derm: Petechiae.  Resolved Hospital Problem list    Liver mass -CT showed hypodense liver masses--> subsequent abdominal MRI neg  acute kidney injury Anion gap metabolic acidosis  Septic shock (resolved and off pressors as of 8/5) Hyponatremia   Assessment & Plan:    Postop acute hypoxic respiratory failure with diffuse bilateral infiltrates favoring aspiration pneumonia (EIKENELLA CORRODENS grew from cultures)   Intubated post EGD ; extubated 8/6 Plan -Continue diuresis -Continue supplemental oxygen as required to maintain SPO2 greater than 90%. -Aggressive pulmonary hygiene -Complete 7-day course of antibiotics  Sepsis 2/2 aspiration pneumonia , no evidence of SBP -pressor requirement improving  Plan -Continue stress dose steroids today -Continue midodrine -Complete 7-day course of antibiotics  New atrial fibrillation with rapid ventricular response, also ST changes in anterior leads Acute on chronic HFpEF -Not a candidate for anticoagulation given recent life-threatening bleeding, nor is she a candidate for left heart cath -echo w/out evidence of WM abnormality. Did have grade II diastolic dysfxn but systolic fxn intact  Plan -Diuresis; adding spironolactone and Lasix -Not a candidate for anticoagulation given recent bleeding   Acute metabolic encephalopathy: This looks a little better today,  she did have active history of EtOH, but this seems more consistent with septic and metabolic encephalopathy as well as hepatic encephalopathy Plan -Continue lactulose   Fluid and electrolyte balance: Hypokalemia massive volume overload/anasarca  Plan -Continue Lasix and electrolyte repletion as needed -Continue monitoring -Add spironolactone to Lasix  UGI bleed with acute blood loss anemia due to severe esophagitis; hemoglobin stable Plan -Continue PPI -Continue tube feeds  EtOH cirrhosis with ascites -Transudative ascites, status post paracentesis -[place] eval at bedside 5/6: mod amt of ascites.  Plan -Continue serial LFT monitoring -Hopefully with diuresis will not require additional paracenteses.  Deconditioning -SLP evaluation; likely require MBSS.  With very weak voice today will restart tube feeds via NG tube rather than nurse bedside swallow. -PT and OT consults   Remove CVC today. Stable for SD.    Best practice:  Diet: npo-->tubefeeds  Pain/Anxiety/Delirium protocol (if indicated):  VAP protocol (if indicated): n/a DVT prophylaxis: SCDs GI prophylaxis: protonix  Glucose control: SSI Mobility: progressive mobility Code Status: full Family Communication: spouse, [name] Disposition: stable for SD  Julian Hy, DO 04/30/20 7:48 AM [facility]

## 2020-04-30 NOTE — Evaluation (Signed)
Physical Therapy Evaluation Patient Details Name: Melissa Kent MRN: [id number] DOB: 26-Apr-1961 Today's Date: 04/30/2020   History of Present Illness  Pt is 59 yo female with EtOH cirrhosis, ascities, HTN, neutrophilia. Pt presented with coffee-ground emesis for 3 days.  She was admitted with upper GI bleed, EGD showed severe esophagitis.  Pt required intubation s/p EGD on 7/31 due to shock and was extubated 03/29/20.  She had paracentesis on 7/31 removing 4 L.  While intubated pt with worsening shock requiring pressors.  Pt now extubated and off pressors.  Clinical Impression  Pt admitted with above diagnosis. Pt presents with profound weakness, decreased mobility, endurance, balance, and safety.  She required total assist of 2 for bed mobility but once at EOB was able to support self for 5 minutes.  Her HR was 107-125 bpm during therapy.   Pt currently with functional limitations due to the deficits listed below (see PT Problem List). Pt will benefit from skilled PT to increase their independence and safety with mobility to allow discharge to the venue listed below.       Follow Up Recommendations SNF    Equipment Recommendations  Wheelchair cushion (measurements PT);Wheelchair (measurements PT);Hospital bed;3in1 (PT);Other (comment) (to be further assessed next venue)    Recommendations for Other Services       Precautions / Restrictions Precautions Precautions: Fall Precaution Comments: urostomy, dubhoff tube      Mobility  Bed Mobility Overal bed mobility: Needs Assistance Bed Mobility: Rolling;Supine to Sit;Sit to Supine Rolling: Total assist;+2 for physical assistance   Supine to sit: Total assist;+2 for physical assistance Sit to supine: Total assist;+2 for physical assistance   General bed mobility comments: Faciliated transfers with positioning (legs flexed, arm crossed to reach, etc) and use of bed pad  Transfers                 General transfer comment:  unable  Ambulation/Gait                Stairs            Wheelchair Mobility    Modified Rankin (Stroke Patients Only)       Balance Overall balance assessment: Needs assistance Sitting-balance support: Bilateral upper extremity supported;Feet unsupported Sitting balance-Leahy Scale: Poor Sitting balance - Comments: Pt requriing total A to get to EOB and min A initially for balance, but was able to sit statically at EOB for 5 minutes with supervision       Standing balance comment: deferred                             Pertinent Vitals/Pain Pain Assessment: Faces Faces Pain Scale: Hurts a little bit Pain Location: Grimaces with transfers Pain Descriptors / Indicators: Grimacing Pain Intervention(s): Limited activity within patient's tolerance;Monitored during session    Home Living Family/patient expects to be discharged to:: Skilled nursing facility Living Arrangements: Spouse/significant other Available Help at Discharge: Family;Available PRN/intermittently (lives with spouse who works 3rd shift) Type of Home: Apartment Home Access: Level entry     Home Layout: One level [place]: Bedside commode;Walker - standard;Hand held shower head      Prior Function Level of Independence: Needs assistance   Gait / Transfers Assistance Needed: normally independent without AD but lately holding onto spouse  ADL's / Homemaking Assistance Needed: Reports independent with ADLS except had supervision in/out shower; assist with IADLs        Hand Dominance  Extremity/Trunk Assessment   Upper Extremity Assessment Upper Extremity Assessment: LUE deficits/detail;RUE deficits/detail RUE Deficits / Details: ROM : WFL except shoulder to ~90 degrees then grimaces; MMT: grossly 1-2/5 throughout LUE Deficits / Details: ROM : WFL except shoulder to ~90 degrees then grimaces; MMT: grossly 1-2/5 throughout    Lower Extremity Assessment Lower  Extremity Assessment: LLE deficits/detail;RLE deficits/detail RLE Deficits / Details: ROM WFL; MMT ankle 3/5, knee ext 3/5, hip 1/5; pitting edema LLE Deficits / Details: ROM WFL except ankle DF limited to lacking 5 degrees to neutral with tight endfeel; MMT ankle 2/5, knee 3/5, hip 1/5 ; pitting edema    Cervical / Trunk Assessment Cervical / Trunk Assessment: Kyphotic  Communication      Cognition Arousal/Alertness: Awake/alert Behavior During Therapy: Flat affect Overall Cognitive Status: Difficult to assess                                 General Comments: Pt with limited verbal response; nods "yes/no" but inconsisten      General Comments General comments (skin integrity, edema, etc.): Pt's HR 107-125 bpm; BP and O2 sats stable    Exercises General Exercises - Lower Extremity Long Arc Quad: AROM;Both;5 reps;Seated Low Level/ICU Exercises Ankle Circles/Pumps: AAROM;Both;5 reps;Supine Heel Slides: AAROM;Both;10 reps;Supine Elbow Flexion: AAROM;Both;10 reps;Supine   Assessment/Plan    PT Assessment Patient needs continued PT services  PT Problem List Decreased strength;Decreased mobility;Decreased safety awareness;Decreased range of motion;Decreased coordination;Decreased activity tolerance;Cardiopulmonary status limiting activity;Decreased balance;Decreased knowledge of use of DME;Decreased cognition;Decreased knowledge of precautions       PT Treatment Interventions Therapeutic activities;Gait training;Therapeutic exercise;Patient/family education;Balance training;Functional mobility training;Neuromuscular re-education;Wheelchair mobility training;DME instruction    PT Goals (Current goals can be found in the Care Plan section)  Acute Rehab PT Goals Patient Stated Goal: unable to state PT Goal Formulation: Patient unable to participate in goal setting Time For Goal Achievement: 05/14/20 Potential to Achieve Goals: Good Additional Goals Additional Goal #1:  Pt will increase throughout extremities to 3/5 to assist with transfers    Frequency Min 2X/week   Barriers to discharge        Co-evaluation               AM-PAC PT "6 Clicks" Mobility  Outcome Measure Help needed turning from your back to your side while in a flat bed without using bedrails?: Total Help needed moving from lying on your back to sitting on the side of a flat bed without using bedrails?: Total Help needed moving to and from a bed to a chair (including a wheelchair)?: Total Help needed standing up from a chair using your arms (e.g., wheelchair or bedside chair)?: Total Help needed to walk in hospital room?: Total Help needed climbing 3-5 steps with a railing? : Total 6 Click Score: 6    End of Session   Activity Tolerance: Patient limited by fatigue Patient left: in bed;with call bell/phone within reach;with bed alarm set;Other (comment) (chair position and L side offloaded) Nurse Communication: Mobility status;Need for lift equipment;Other (comment) (recommendation for Prevelon boots (RN ordering)) PT Visit Diagnosis: Muscle weakness (generalized) (M62.81);Unsteadiness on feet (R26.81)    Time: 1035-1105 PT Time Calculation (min) (ACUTE ONLY): 30 min   Charges:   PT Evaluation $PT Eval Moderate Complexity: 1 Mod PT Treatments $Therapeutic Exercise: 8-22 mins        Abran [name], PT Acute Rehab Services Pager [phone] [facility] [phone]  Mikael Spray Julliana Whitmyer 04/30/2020, 1:14 PM

## 2020-05-01 DIAGNOSIS — Z515 Encounter for palliative care: Secondary | ICD-10-CM

## 2020-05-01 DIAGNOSIS — K922 Gastrointestinal hemorrhage, unspecified: Secondary | ICD-10-CM | POA: Diagnosis not present

## 2020-05-01 DIAGNOSIS — K7031 Alcoholic cirrhosis of liver with ascites: Secondary | ICD-10-CM | POA: Diagnosis not present

## 2020-05-01 DIAGNOSIS — R6521 Severe sepsis with septic shock: Secondary | ICD-10-CM

## 2020-05-01 DIAGNOSIS — Z87898 Personal history of other specified conditions: Secondary | ICD-10-CM

## 2020-05-01 DIAGNOSIS — Z7189 Other specified counseling: Secondary | ICD-10-CM

## 2020-05-01 DIAGNOSIS — E43 Unspecified severe protein-calorie malnutrition: Secondary | ICD-10-CM

## 2020-05-01 DIAGNOSIS — A419 Sepsis, unspecified organism: Secondary | ICD-10-CM

## 2020-05-01 DIAGNOSIS — J690 Pneumonitis due to inhalation of food and vomit: Secondary | ICD-10-CM | POA: Diagnosis not present

## 2020-05-01 DIAGNOSIS — R160 Hepatomegaly, not elsewhere classified: Secondary | ICD-10-CM

## 2020-05-01 DIAGNOSIS — J9601 Acute respiratory failure with hypoxia: Secondary | ICD-10-CM | POA: Diagnosis not present

## 2020-05-01 LAB — FERRITIN: Ferritin: 628 ng/mL — ABNORMAL HIGH (ref 11–307)

## 2020-05-01 LAB — CBC
HCT: 36.7 % (ref 36.0–46.0)
Hemoglobin: 11.5 g/dL — ABNORMAL LOW (ref 12.0–15.0)
MCH: 29.6 pg (ref 26.0–34.0)
MCHC: 31.3 g/dL (ref 30.0–36.0)
MCV: 94.3 fL (ref 80.0–100.0)
Platelets: 104 10*3/uL — ABNORMAL LOW (ref 150–400)
RBC: 3.89 MIL/uL (ref 3.87–5.11)
RDW: 20.4 % — ABNORMAL HIGH (ref 11.5–15.5)
WBC: 30.9 10*3/uL — ABNORMAL HIGH (ref 4.0–10.5)
nRBC: 0 % (ref 0.0–0.2)

## 2020-05-01 LAB — BASIC METABOLIC PANEL
Anion gap: 12 (ref 5–15)
BUN: 38 mg/dL — ABNORMAL HIGH (ref 6–20)
CO2: 19 mmol/L — ABNORMAL LOW (ref 22–32)
Calcium: 10.6 mg/dL — ABNORMAL HIGH (ref 8.9–10.3)
Chloride: 119 mmol/L — ABNORMAL HIGH (ref 98–111)
Creatinine, Ser: 0.96 mg/dL (ref 0.44–1.00)
GFR calc Af Amer: >60 mL/min (ref >60)
GFR calc non Af Amer: >60 mL/min (ref >60)
Glucose, Bld: 171 mg/dL — ABNORMAL HIGH (ref 70–99)
Potassium: 4 mmol/L (ref 3.5–5.1)
Sodium: 150 mmol/L — ABNORMAL HIGH (ref 135–145)

## 2020-05-01 LAB — GLUCOSE, CAPILLARY
Glucose-Capillary: 163 mg/dL — ABNORMAL HIGH (ref 70–99)
Glucose-Capillary: 180 mg/dL — ABNORMAL HIGH (ref 70–99)
Glucose-Capillary: 153 mg/dL — ABNORMAL HIGH (ref 70–99)
Glucose-Capillary: 175 mg/dL — ABNORMAL HIGH (ref 70–99)
Glucose-Capillary: 171 mg/dL — ABNORMAL HIGH (ref 70–99)
Glucose-Capillary: 184 mg/dL — ABNORMAL HIGH (ref 70–99)

## 2020-05-01 LAB — IRON AND TIBC
Iron: 84 ug/dL (ref 28–170)
Saturation Ratios: 85 % — ABNORMAL HIGH (ref 10.4–31.8)
TIBC: 98 ug/dL — ABNORMAL LOW (ref 250–450)
UIBC: 14 ug/dL

## 2020-05-01 LAB — FOLATE: Folate: 13.9 ng/mL (ref >5.9)

## 2020-05-01 LAB — PROTIME-INR
INR: 1.5 — ABNORMAL HIGH (ref 0.8–1.2)
Prothrombin Time: 17.7 seconds — ABNORMAL HIGH (ref 11.4–15.2)

## 2020-05-01 LAB — RETICULOCYTES
Immature Retic Fract: 18 % — ABNORMAL HIGH (ref 2.3–15.9)
RBC.: 3.76 MIL/uL — ABNORMAL LOW (ref 3.87–5.11)
Retic Count, Absolute: 102.6 10*3/uL (ref 19.0–186.0)
Retic Ct Pct: 2.7 % (ref 0.4–3.1)

## 2020-05-01 LAB — VITAMIN B12: Vitamin B-12: 774 pg/mL (ref 180–914)

## 2020-05-01 LAB — MAGNESIUM: Magnesium: 2.1 mg/dL (ref 1.7–2.4)

## 2020-05-01 LAB — PHOSPHORUS: Phosphorus: 3.3 mg/dL (ref 2.5–4.6)

## 2020-05-01 MED ORDER — FREE WATER
100.0000 mL | Freq: Four times a day (QID) | Status: DC
  Administered 2020-05-01 – 2020-05-03 (×7): 100 mL

## 2020-05-01 MED ORDER — RIFAXIMIN 550 MG PO TABS
550.0000 mg | ORAL_TABLET | Freq: Two times a day (BID) | ORAL | Status: DC
  Administered 2020-05-01 – 2020-05-05 (×9): 550 mg via ORAL
  Filled 2020-05-01 (×9): qty 1

## 2020-05-01 MED ORDER — HYDROCORTISONE NA SUCCINATE PF 100 MG IJ SOLR
50.0000 mg | Freq: Two times a day (BID) | INTRAMUSCULAR | Status: DC
  Administered 2020-05-01 – 2020-05-04 (×6): 50 mg via INTRAVENOUS
  Filled 2020-05-01 (×6): qty 2

## 2020-05-01 NOTE — Progress Notes (Addendum)
PROGRESS NOTE    Melissa Kent   YKD:[id number]  DOB: May 25, 1961  DOA: 03/30/2020 PCP: Janie Morning, DO   Brief Narrative:  Melissa Kent is a 59 y/o with alcohol abuse, tobacco abuse, HTN, cirrhosis, ascites who presented with coffee ground emesis x 3 days and generalized weakness.  In the ED> tachycardic, hypotensive, sodium 123, WBC 20.8   EGD 7/31> severe reflux esophagitis RUQ ultrasound 7/31 >> large ascites, liver mass   Intubated post EGD 7/31 4 L paracentesis- ascitic fluid negative for infection 8/1: Rare EIKENELLA CORRODENS in sputum culture- Ceftriaxone changed to Unasyn 8/2: worsening shock- on pressors and receiving blood 8/3 developed new A-fib- placed on Amio infusion 8/5 off vasopressin- Still on Levophed - ECHO w/ nml EF but gd II diastolic dysfxn. Giving lasix. Stop fent gtt.  8/6: Off all pressors, had adequate diuresis, extubated Still on tube feeds, liquid stool in flexiseal, moderate ascites in abdomen  Subjective:   Doesn't answer most questions. Responses are delayed and mumbled and thus difficult to understand.    Assessment & Plan:   Principal Problem:   Upper GI bleed- anemia due to acute blood loss - related to erosive esophagitis - Hb dropped from 8.1 to 7.8 - currently 10-11 range - hematemesis has resolved- stool is dark but not black - cont BID PPI    Active Problems:  Bilateral infiltrates- CHF and suspected pneumonia Acute respiratory failure requiring extubation Septic shock  - likely aspiration due to hematemesis- Eikenella Corroden's in Sputum culture - BNP > 45,000- given diuretics and has diuresed - stop Aldactone today and allow sodium to drift down from 150 - now on room air - completes 7 days of Unasyn today - wean Solumedrol which is 50 Q6- wean to 50 Q 12 - cont Midodrine at current dose for today  Acute diastolic CHF - ECHO > Grade 2 d CHF with mod-severe MVR - see above about Aldactone  A-fib with RVR - s/p  Amiodarone infusion which has been stopped- in NSR now- ECHO noted below  Fluid overload followed by Hypernatremia and dehydration  Third spacing due to malnutrion - pitting edema up to thighs but intravascularly depleted - Sodium 150 today- stop Aldactone - start a small amount of free water per tube - follow I and O and daily weights  Leukocytosis with diarrhea and pneumonia  -leukocytosis in setting of stress dose steroid use- wean steroids and follow WBC - change Lactulose to Xifaxin for now - ? C diff colitis- follow stool output and WBC counts      Alcohol use - cirrhosis seen on CT scan - currently no withdrawal symptoms -cont thiamine, folic acid and MVI    Ascites due to cirrhosis - s/p paracentesis as mentioned above - currently mild- to moderate ascites     Large ill-defined hypodense liver masses, concerning for primary hepatic neoplasm - check AFP     Elevated INR - 1.4 when checked on 04/03/2020- recheck today   Protein-calorie malnutrition, severe - currently on NG tube feeds as she has not been able to swallow - MBS planned -  tube feeds running at 40 cc/hr - goal is 60 cc - stop diuretics and add free water 100 cc QID for now - cont protein supplements  Time spent in minutes: 45 DVT prophylaxis: SCDs Code Status: Full code Family Communication:  Disposition Plan:  Status is: Inpatient  Remains inpatient appropriate because:unastable patient with tube feeds and multiple medical issues   Dispo: The patient  is from: Home              Anticipated d/c is to: TBD              Anticipated d/c date is: > 3 days              Patient currently is not medically stable to d/c.  Consultants:   GI  PCCM Procedures:  1. Left ventricular ejection fraction, by estimation, is 55 to 60%. The  left ventricle has normal function. The left ventricle has no regional  wall motion abnormalities. Left ventricular diastolic parameters are  consistent with Grade II  diastolic  dysfunction (pseudonormalization). Elevated left atrial pressure.  2. Right ventricular systolic function is normal. The right ventricular  size is normal. There is normal pulmonary artery systolic pressure.  3. Left atrial size was severely dilated.  4. Right atrial size was moderately dilated.  5. The mitral valve is grossly normal. Moderate to severe mitral valve  regurgitation. No evidence of mitral stenosis.  6. Tricuspid valve regurgitation is severe.  7. The aortic valve is tricuspid. Aortic valve regurgitation is mild to  moderate. Mild aortic valve sclerosis is present, with no evidence of  aortic valve stenosis.  Antimicrobials:  Anti-infectives (From admission, onward)   Start     Dose/Rate Route Frequency Ordered Stop   04/26/20 2000  Ampicillin-Sulbactam (UNASYN) 3 g in sodium chloride 0.9 % 100 mL IVPB        3 g 200 mL/hr over 30 Minutes Intravenous Every 6 hours 04/26/20 1820 04/30/20 2116   04/25/20 1200  vancomycin (VANCOREADY) IVPB 500 mg/100 mL  Status:  Discontinued        500 mg 100 mL/hr over 60 Minutes Intravenous Every 24 hours 04/24/20 1014 04/25/20 1225   04/24/20 1130  Ampicillin-Sulbactam (UNASYN) 3 g in sodium chloride 0.9 % 100 mL IVPB  Status:  Discontinued        3 g 200 mL/hr over 30 Minutes Intravenous Every 12 hours 04/24/20 1014 04/26/20 1820   04/24/20 1130  vancomycin (VANCOCIN) IVPB 1000 mg/200 mL premix        1,000 mg 200 mL/hr over 60 Minutes Intravenous  Once 04/24/20 1014 04/24/20 1331   03/29/2020 1600  cefTRIAXone (ROCEPHIN) 2 g in sodium chloride 0.9 % 100 mL IVPB  Status:  Discontinued        2 g 200 mL/hr over 30 Minutes Intravenous Every 24 hours 03/26/2020 1458 04/24/20 0940       Objective: Vitals:   05/01/20 0500 05/01/20 0600 05/01/20 0700 05/01/20 0718  BP: 121/61 122/77 125/68   Pulse: 87 95 (!) 52   Resp: _0 Temp:    (!) 97.1 F (36.2 C)  TempSrc:    Axillary  SpO2: 96% 97% 97%   Weight:        Height:        Intake/Output Summary (Last 24 hours) at 05/01/2020 0754 Last data filed at 05/01/2020 0441 Gross per 24 hour  Intake 694.74 ml  Output 3070 ml  Net -2375.26 ml   Filed Weights   04/29/20 0500 04/30/20 0500 05/01/20 0438  Weight: 64.1 kg 66.2 kg 65 kg    Examination: General exam: alert- Appears very weak HEENT: PERRLA, oral mucosa dry, very poor dentition, no sclera icterus or thrush Respiratory system: Clear to auscultation. Respiratory effort normal. Cardiovascular system: S1 & S2 heard, RRR.   Gastrointestinal system: Abdomen soft, tender diffusely, nondistended. Normal  bowel sounds. Central nervous system: Alert- voice barely audible- doesn't answer questions appropriates- Can move arms but not legs- can wiggle toes. Extremities: No cyanosis, clubbing - pitting edema upto thighs Skin: has scabs and bruises Psychiatry:  Flat affect    Data Reviewed: I have personally reviewed following labs and imaging studies  CBC: Recent Labs  Lab 04/26/20 1706 04/28/20 0436 04/29/20 0229 04/30/20 0618 05/01/20 0122  WBC 23.3* 24.2* 24.5* 26.5* 30.9*  HGB 8.3* 8.8* 9.5* 10.8* 11.5*  HCT 24.9* 26.3* 28.6* 32.7* 36.7  MCV 85.9 87.1 87.7 88.4 94.3  PLT 219 140* 96* 85* 267*   Basic Metabolic Panel: Recent Labs  Lab 04/25/20 1134 04/25/20 1134 04/25/20 1715 04/25/20 1715 04/26/20 0520 04/26/20 0520 04/26/20 1706 04/27/20 0515 04/27/20 1630 04/28/20 1102 04/29/20 0229 04/30/20 0618 05/01/20 0122  NA 131*   < > 130*   < > 134*   < > 136   < > 139 141 141 146* 150*  K 3.6   < > 3.9   < > 3.5   < > 3.4*   < > 3.8 3.2* 2.9* 4.6 4.0  CL 101   < > 101   < > 103   < > 107   < > 109 111 110 114* 119*  CO2 13*   < > 12*   < > 17*   < > 16*   < > 21* 19* 20* 18* 19*  GLUCOSE 136*   < > 181*   < > 175*   < > 217*   < > 203* 214* 171* 138* 171*  BUN 23*   < > 21*   < > 20   < > 19   < > 20 25* 27* 29* 38*  CREATININE 1.32*   < > 1.20*   < > 1.03*   < > 1.00   < >  0.83 0.91 0.97 0.86 0.96  CALCIUM 6.8*   < > 7.0*   < > 7.6*   < > 7.8*   < > 8.9 9.0 9.4 10.3 10.6*  MG 2.2  --  2.6*  --  2.3  --  2.0  --   --   --   --   --  2.1  PHOS 3.2  --  4.9*  --  3.1  --  2.2*  --   --   --   --   --  3.3   < > = values in this interval not displayed.   GFR: Estimated Creatinine Clearance: 55.2 mL/min (by C-G formula based on SCr of 0.96 mg/dL). Liver Function Tests: Recent Labs  Lab 04/26/20 1706 04/27/20 0515 04/27/20 1630 04/29/20 0229 04/30/20 0618  AST _0 53*  ALT _1 ALKPHOS 96 116 125 134* 282*  BILITOT 2.5* 2.3* 1.8* 1.4* 1.5*  PROT 5.7* 5.6* 5.8* 5.2* 5.7*  ALBUMIN 3.5 3.4* 3.3* 2.6* 2.7*   No results for input(s): LIPASE, AMYLASE in the last 168 hours. Recent Labs  Lab 04/27/20 0515  AMMONIA 68*   Coagulation Profile: No results for input(s): INR, PROTIME in the last 168 hours. Cardiac Enzymes: No results for input(s): CKTOTAL, CKMB, CKMBINDEX, TROPONINI in the last 168 hours. BNP (last 3 results) No results for input(s): PROBNP in the last 8760 hours. HbA1C: Recent Labs    04/29/20 0229  HGBA1C 5.0   CBG: Recent Labs  Lab 04/30/20 1543 04/30/20 1937 04/30/20 2342 05/01/20 0431 05/01/20 1245  GLUCAP 138* 166* 158* 163* 180*   Lipid Profile: No results for input(s): CHOL, HDL, LDLCALC, TRIG, CHOLHDL, LDLDIRECT in the last 72 hours. Thyroid Function Tests: No results for input(s): TSH, T4TOTAL, FREET4, T3FREE, THYROIDAB in the last 72 hours. Anemia Panel: No results for input(s): VITAMINB12, FOLATE, FERRITIN, TIBC, IRON, RETICCTPCT in the last 72 hours. Urine analysis:    Component Value Date/Time   COLORURINE YELLOW 04/30/2020 1252   APPEARANCEUR CLEAR 04/30/2020 1252   LABSPEC 1.011 04/30/2020 1252   PHURINE 5.0 04/30/2020 1252   GLUCOSEU NEGATIVE 04/30/2020 1252   HGBUR NEGATIVE 04/30/2020 1252   BILIRUBINUR NEGATIVE 04/30/2020 1252   KETONESUR NEGATIVE 04/30/2020 1252   PROTEINUR NEGATIVE  04/30/2020 1252   NITRITE NEGATIVE 04/30/2020 1252   LEUKOCYTESUR NEGATIVE 04/30/2020 1252   Sepsis Labs: _0 (procalcitonin:4,lacticidven:4) ) Recent Results (from the past 240 hour(s))  SARS Coronavirus 2 by RT PCR (hospital order, performed in [place] hospital lab) Nasopharyngeal Nasopharyngeal Swab     Status: None   Collection Time: 04/12/2020  1:10 AM   Specimen: Nasopharyngeal Swab  Result Value Ref Range Status   SARS Coronavirus 2 NEGATIVE NEGATIVE Final    Comment: (NOTE) SARS-CoV-2 target nucleic acids are NOT DETECTED.  The SARS-CoV-2 RNA is generally detectable in upper and lower respiratory specimens during the acute phase of infection. The lowest concentration of SARS-CoV-2 viral copies this assay can detect is 250 copies / mL. A negative result does not preclude SARS-CoV-2 infection and should not be used as the sole basis for treatment or other patient management decisions.  A negative result may occur with improper specimen collection / handling, submission of specimen other than nasopharyngeal swab, presence of viral mutation(s) within the areas targeted by this assay, and inadequate number of viral copies (<250 copies / mL). A negative result must be combined with clinical observations, patient history, and epidemiological information.  Fact Sheet for Patients:   StrictlyIdeas.no  Fact Sheet for Healthcare Providers: BankingDealers.co.za  This test is not yet approved or  cleared by the [place] FDA and has been authorized for detection and/or diagnosis of SARS-CoV-2 by FDA under an Emergency Use Authorization (EUA).  This EUA will remain in effect (meaning this test can be used) for the duration of the COVID-19 declaration under Section 564(b)(1) of the Act, 21 U.S.C. section 360bbb-3(b)(1), unless the authorization is terminated or revoked sooner.  Performed at [facility],  [place] [address] [place], [place] [address]   MRSA PCR Screening     Status: None   Collection Time: 04/21/2020  3:10 PM   Specimen: Nasopharyngeal  Result Value Ref Range Status   MRSA by PCR NEGATIVE NEGATIVE Final    Comment:        The GeneXpert MRSA Assay (FDA approved for NASAL specimens only), is one component of a comprehensive MRSA colonization surveillance program. It is not intended to diagnose MRSA infection nor to guide or monitor treatment for MRSA infections. Performed at [facility], [place] [address] [place], [place] [address]   Body fluid culture     Status: None   Collection Time: 04/01/2020  5:00 PM   Specimen: Peritoneal Washings; Body Fluid  Result Value Ref Range Status   Specimen Description   Final    PERITONEAL Performed at [place] [address], [place], [place] [address]    Special Requests   Final    NONE Performed at [facility], [place] [address]  [place], [place] [address]    Gram Stain   Final    RARE WBC PRESENT, PREDOMINANTLY MONONUCLEAR NO ORGANISMS SEEN    Culture   Final    NO GROWTH 3 DAYS Performed at [place] [address] [place], [place] [address]    Report Status 04/27/2020 FINAL  Final  Culture, blood (Routine X 2) w Reflex to ID Panel     Status: None   Collection Time: 04/24/20  8:46 AM   Specimen: BLOOD  Result Value Ref Range Status   Specimen Description   Final    BLOOD BLOOD LEFT HAND Performed at [facility] [address] [place], [place] [address]    Special Requests   Final    BOTTLES DRAWN AEROBIC ONLY Blood Culture results may not be optimal due to an inadequate volume of blood received in culture bottles Performed at [facility] [address], [place], [place] [address]    Culture   Final    NO GROWTH 5 DAYS Performed at [facility], [place] [address], [place], [place] [address]     Report Status 04/29/2020 FINAL  Final  Culture, respiratory (non-expectorated)     Status: None   Collection Time: 04/24/20  9:40 AM   Specimen: Tracheal Aspirate; Respiratory  Result Value Ref Range Status   Specimen Description   Final    TRACHEAL ASPIRATE Performed at [facility] [address], [place], [place] [address]    Special Requests   Final    NONE Performed at [facility], [place] [address] [place], [place] [address]    Gram Stain   Final    FEW WBC PRESENT,BOTH PMN AND MONONUCLEAR RARE GRAM POSITIVE COCCI IN PAIRS RARE GRAM NEGATIVE RODS    Culture   Final    RARE EIKENELLA CORRODENS Usually susceptible to penicillin and other beta lactam agents,quinolones,macrolides and tetracyclines. Performed at [facility], [place] [address], [place], [place] [address]    Report Status 04/27/2020 FINAL  Final         Radiology Studies: DG Chest Port 1 View  Result Date: 04/30/2020 CLINICAL DATA:  Pulmonary edema EXAM: PORTABLE CHEST 1 VIEW COMPARISON:  04/29/2020 FINDINGS: There is left retrocardiac consolidation, unchanged. Enteric tube side port projects over the stomach. Left IJ approach central venous catheter tip is at the cavoatrial junction. No pulmonary edema. Endotracheal tube no longer present. IMPRESSION: Unchanged left retrocardiac consolidation. Electronically Signed   By: Ulyses Jarred M.D.   On: 04/30/2020 05:15      Scheduled Meds: . chlorhexidine gluconate (MEDLINE KIT)  15 mL Mouth Rinse BID  . Chlorhexidine Gluconate Cloth  6 each Topical Daily  . feeding supplement (PROSource TF)  45 mL Per Tube BID  . folic acid  1 mg Per Tube Daily  . hydrocortisone sod succinate (SOLU-CORTEF) inj  50 mg Intravenous Q6H  . insulin aspart  0-9 Units Subcutaneous Q4H  . lactulose  30 g Per Tube BID  . mouth rinse  15 mL Mouth Rinse 10 times per day  . midodrine  10 mg Per Tube TID WC  . multivitamin with minerals  1 tablet  Per Tube Daily  . pantoprazole  40 mg Intravenous Q12H  . spironolactone  50 mg Per Tube Daily  . thiamine  100 mg Intravenous Daily   Continuous Infusions: . sodium chloride 10 mL/hr at 04/30/20 1842  . feeding supplement (OSMOLITE 1.2  CAL) 1,000 mL (05/01/20 0447)     LOS: 8 days      Debbe Odea, MD [facility] Pager: [email or web address] 05/01/2020, 7:54 AM

## 2020-05-01 NOTE — Consult Note (Signed)
Consultation Note Date: 05/01/2020   Patient Name: Melissa Kent  DOB: 1961/06/05  MRN: [id number]  Age / Sex: 59 y.o., female  PCP: Janie Morning, DO Referring Physician: Debbe Odea, MD  Reason for Consultation: Establishing goals of care  HPI/Patient Profile: 59 y.o. female  with past medical history of alcoholic cirrhosis with ascites, HTN, neutrophilia admitted on 03/24/2020 with 3 days of hematemesis. She was hypotensive, had increased WBC and ascites. EGD was performed and afterward patient required intubation, and IV pressors due to shock. EGD findings showed esophagitis and gastritis. RUQ u/s performed on 7/31 showed large ascites and concern for mass, however, no mass noted on MRI of abdomen. Further workup revealed septic shock, pneumonia, with sputum culture positive for rare Eikenella Corrodens. On 8/4 had EKG changes indicating STEMI- not able to anticoagulate due to GI bleeding. Off pressors and extubated on 8/6. Currently has large bore feeding tube in place, unable to complete bedside swallow eval due to weakness. Palliative medicine consulted for goals of care.   Clinical Assessment and Goals of Care: Evaluated patient in her bed. She was in bed in chair position. She is nonverbal. She nods her head yes to all questions no matter the answer.  She is not able to engage in goals of care discussion.   I called her spouse- Lore Polka. Introduced Palliative Medicine.  Palliative medicine is specialized medical care for people living with serious illness. It focuses on providing relief from the symptoms and stress of a serious illness. The goal is to improve quality of life for both the patient and the family.  He tells me that the goals for Darlette are to get her back to her prior health.  Primary Decision Maker NEXT OF KIN- spouse- Yatziry Deakins    SUMMARY OF RECOMMENDATIONS -Continue current  scope of treatment. Plan to meet with spouse tomorrow at 10am for full advanced care planning discussion     Code Status/Advance Care Planning:  Full code  Prognosis:    Unable to determine  Discharge Planning: To Be Determined  Primary Diagnoses: Present on Admission:  Upper GI bleed   I have reviewed the medical record, interviewed the patient and family, and examined the patient. The following aspects are pertinent.   No Known Allergies Review of Systems  Unable to perform ROS: Mental status change    Physical Exam Vitals and nursing note reviewed.  Constitutional:      Comments: Cachetic, frail  HENT:     Mouth/Throat:     Comments: Poor dentition Neurological:     Comments: Unable to assess orientation, nonverbal     Vital Signs: BP 123/74    Pulse 90    Temp (!) 97.1 F (36.2 C) (Axillary)    Resp (!) 23    Ht 5\' 2"  (1.575 m)    Wt 65 kg    SpO2 97%    BMI 26.21 kg/m  Pain Scale: 0-10    SpO2: SpO2: 97 % O2 Device:SpO2: 97 % O2 Flow Rate: .O2  Flow Rate (L/min): 2 L/min  IO: Intake/output summary:   Intake/Output Summary (Last 24 hours) at 05/01/2020 1534 Last data filed at 05/01/2020 1500 Gross per 24 hour  Intake 637.45 ml  Output 2300 ml  Net -1662.55 ml    LBM: Last BM Date: 05/01/20 (flexiseal in place) Baseline Weight: Weight: 52.2 kg Most recent weight: Weight: 65 kg     Palliative Assessment/Data: PPS: 10%     Thank you for this consult. Palliative medicine will continue to follow and assist as needed.   Time In: 1500 Time Out: 1600 Time Total: 60 minutes Greater than 50%  of this time was spent counseling and coordinating care related to the above assessment and plan.  Signed by: Mariana Kaufman, AGNP-C Palliative Medicine    Please contact Palliative Medicine Team phone at [phone] for questions and concerns.  For individual provider: See Shea Evans

## 2020-05-01 NOTE — Evaluation (Signed)
Occupational Therapy Evaluation Patient Details Name: Melissa Kent MRN: [id number] DOB: Aug 01, 1961 Today's Date: 05/01/2020    History of Present Illness Pt is 59 yo female with EtOH cirrhosis, ascities, HTN, neutrophilia. Pt presented with coffee-ground emesis for 3 days.  She was admitted with upper GI bleed, EGD showed severe esophagitis.  Pt required intubation s/p EGD on 7/31 due to shock and was extubated 03/29/20.  She had paracentesis on 7/31 removing 4 L.  While intubated pt with worsening shock requiring pressors.  Pt now extubated and off pressors.   Clinical Impression   Pt admitted with above. She demonstrates the below listed deficits and will benefit from continued OT to maximize safety and independence with BADLs.  Pt presents to OT with generalized weakness, decreased activity tolerance, impaired bil. UE funciton, impaired balance, impaired cognition.  She currently requires total A for all aspects of ADLs and functional mobility.  Recommend SNF level rehab at discharge.  Will follow acutely.       Follow Up Recommendations  SNF;No OT follow up    Equipment Recommendations  None recommended by OT    Recommendations for Other Services       Precautions / Restrictions Precautions Precautions: Fall Precaution Comments: urostomy, dubhoff tube      Mobility Bed Mobility               General bed mobility comments: Bed moved into chair/egress.  She was able to lift self slightly away from the bed with max A   Transfers                 General transfer comment: unable     Balance Overall balance assessment: Needs assistance Sitting-balance support: Bilateral upper extremity supported Sitting balance-Leahy Scale: Zero Sitting balance - Comments: pt requires max A to sit unsupported in chair/egress position                                    ADL either performed or assessed with clinical judgement   ADL Overall ADL's : Needs  assistance/impaired Eating/Feeding: NPO   Grooming: Wash/dry hands;Wash/dry face;Oral care;Brushing hair;Total assistance;Bed level   Upper Body Bathing: Total assistance;Bed level   Lower Body Bathing: Total assistance;Bed level   Upper Body Dressing : Total assistance;Bed level   Lower Body Dressing: Total assistance;Bed level   Toilet Transfer: Total assistance Toilet Transfer Details (indicate cue type and reason): unable to attempt  Toileting- Clothing Manipulation and Hygiene: Total assistance;Bed level       Functional mobility during ADLs: Total assistance       Vision   Additional Comments: pt unable to provide visual history.  She tracks therapist Lt and Rt, but unable to sustain attention adequately to complete visual assessment      Perception     Praxis Praxis Praxis tested?: Deficits Deficits: Initiation    Pertinent Vitals/Pain Pain Assessment: Faces Faces Pain Scale: Hurts little more Pain Location: generalized grimmacing  Pain Descriptors / Indicators: Grimacing Pain Intervention(s): Monitored during session;Repositioned     Hand Dominance Right   Extremity/Trunk Assessment Upper Extremity Assessment Upper Extremity Assessment: Generalized weakness;RUE deficits/detail;LUE deficits/detail RUE Deficits / Details: grossly 1/5 throughout.  PROM shoulder to ~90* with pt grimmacing.  She does nod "yes" when asked if she has history of Rt shoulder pain/issues, but unable to confirm  RUE Coordination: decreased fine motor;decreased gross motor LUE Deficits / Details: grossly 1/5.  PROM WFL  LUE Coordination: decreased fine motor;decreased gross motor   Lower Extremity Assessment Lower Extremity Assessment: Defer to PT evaluation   Cervical / Trunk Assessment Cervical / Trunk Assessment: Kyphotic;Other exceptions Cervical / Trunk Exceptions: decreased trunk control    Communication Communication Communication: Expressive difficulties (pt mostly non  verbal )   Cognition Arousal/Alertness: Awake/alert Behavior During Therapy: Flat affect Overall Cognitive Status: Impaired/Different from baseline                                 General Comments: Pt mostly non verbal.  Will attempt to speak on or two words, but not very successful.  She will provide yes/no answers via head nod/shake, but demonstrates a significant delay in responses and is not consistent with responses.  She follows one simple motor commands ~50% of the time with moderate cues/prompting    General Comments  HR to 108 with activity     Exercises Exercises: General Upper Extremity General Exercises - Upper Extremity Shoulder Flexion: AROM;Right;Left;10 reps;Supine Shoulder Extension: AAROM;Right;10 reps;Supine Shoulder ABduction: AROM;Right;10 reps;Supine Shoulder ADduction: AROM;Right;10 reps;Supine Elbow Flexion: AAROM;Right;Left;10 reps;Supine Elbow Extension: AAROM;Right;Left;10 reps;Supine   Shoulder Instructions      Home Living Family/patient expects to be discharged to:: Skilled nursing facility Living Arrangements: Spouse/significant other Available Help at Discharge: Family;Available PRN/intermittently Type of Home: Apartment Home Access: Level entry     Home Layout: One level     Bathroom Shower/Tub: Tub/shower unit         Home Equipment: Bedside commode;Walker - standard;Hand held shower head   Additional Comments: lives with spouse who works third shift       Prior Functioning/Environment Level of Independence: Needs Product/process development scientist / Transfers Assistance Needed: normally independent without AD but lately holding onto spouse ADL's / Homemaking Assistance Needed: Reports independent with ADLS except had supervision in/out shower; assist with IADLs            OT Problem List: Decreased strength;Decreased activity tolerance;Decreased range of motion;Impaired balance (sitting and/or standing);Decreased  coordination;Decreased cognition;Decreased safety awareness;Decreased knowledge of use of DME or AE;Impaired UE functional use;Pain      OT Treatment/Interventions: Self-care/ADL training;Therapeutic exercise;Energy conservation;Neuromuscular education;DME and/or AE instruction;Manual therapy;Therapeutic activities;Cognitive remediation/compensation;Patient/family education;Balance training    OT Goals(Current goals can be found in the care plan section) ADL Goals Pt Will Perform Grooming: with mod assist;sitting Pt Will Perform Upper Body Bathing: with max assist;sitting Additional ADL Goal #1: Pt will sustain attention to simple grooming task x 1 min  OT Frequency: Min 2X/week   Barriers to D/C: Decreased caregiver support  spouse apparently works third shift, and unsure he can provide adequate amount of assist at discharge        Co-evaluation              AM-PAC OT "6 Clicks" Daily Activity     Outcome Measure Help from another person eating meals?: Total Help from another person taking care of personal grooming?: Total Help from another person toileting, which includes using toliet, bedpan, or urinal?: Total Help from another person bathing (including washing, rinsing, drying)?: Total Help from another person to put on and taking off regular upper body clothing?: Total Help from another person to put on and taking off regular lower body clothing?: Total 6 Click Score: 6   End of Session Nurse Communication: Mobility status  Activity Tolerance: Patient limited by fatigue Patient left: in bed;with call bell/phone  within reach (with bed in egress/chair position - RN notified )  OT Visit Diagnosis: Muscle weakness (generalized) (M62.81)                Time:  -    Charges:     Nilsa Nutting., OTR/L Acute Rehabilitation Services Pager [phone] Office [phone]   Lucille Passy M 05/01/2020, 3:07 PM

## 2020-05-02 ENCOUNTER — Inpatient Hospital Stay (HOSPITAL_COMMUNITY): Payer: BC Managed Care – PPO

## 2020-05-02 DIAGNOSIS — J9601 Acute respiratory failure with hypoxia: Secondary | ICD-10-CM | POA: Diagnosis not present

## 2020-05-02 DIAGNOSIS — L8990 Pressure ulcer of unspecified site, unspecified stage: Secondary | ICD-10-CM | POA: Insufficient documentation

## 2020-05-02 DIAGNOSIS — N179 Acute kidney failure, unspecified: Secondary | ICD-10-CM | POA: Diagnosis not present

## 2020-05-02 DIAGNOSIS — K7031 Alcoholic cirrhosis of liver with ascites: Secondary | ICD-10-CM | POA: Diagnosis not present

## 2020-05-02 DIAGNOSIS — Z515 Encounter for palliative care: Secondary | ICD-10-CM | POA: Diagnosis not present

## 2020-05-02 DIAGNOSIS — J690 Pneumonitis due to inhalation of food and vomit: Secondary | ICD-10-CM | POA: Diagnosis not present

## 2020-05-02 DIAGNOSIS — Z7189 Other specified counseling: Secondary | ICD-10-CM | POA: Diagnosis not present

## 2020-05-02 DIAGNOSIS — K922 Gastrointestinal hemorrhage, unspecified: Secondary | ICD-10-CM | POA: Diagnosis not present

## 2020-05-02 LAB — GLUCOSE, CAPILLARY
Glucose-Capillary: 144 mg/dL — ABNORMAL HIGH (ref 70–99)
Glucose-Capillary: 151 mg/dL — ABNORMAL HIGH (ref 70–99)
Glucose-Capillary: 156 mg/dL — ABNORMAL HIGH (ref 70–99)
Glucose-Capillary: 161 mg/dL — ABNORMAL HIGH (ref 70–99)
Glucose-Capillary: 164 mg/dL — ABNORMAL HIGH (ref 70–99)
Glucose-Capillary: 181 mg/dL — ABNORMAL HIGH (ref 70–99)

## 2020-05-02 LAB — PHOSPHORUS: Phosphorus: 3.5 mg/dL (ref 2.5–4.6)

## 2020-05-02 LAB — CBC
HCT: 34.6 % — ABNORMAL LOW (ref 36.0–46.0)
Hemoglobin: 11.3 g/dL — ABNORMAL LOW (ref 12.0–15.0)
MCH: 30.1 pg (ref 26.0–34.0)
MCHC: 32.7 g/dL (ref 30.0–36.0)
MCV: 92.3 fL (ref 80.0–100.0)
Platelets: 128 10*3/uL — ABNORMAL LOW (ref 150–400)
RBC: 3.75 MIL/uL — ABNORMAL LOW (ref 3.87–5.11)
RDW: 20.7 % — ABNORMAL HIGH (ref 11.5–15.5)
WBC: 32.3 10*3/uL — ABNORMAL HIGH (ref 4.0–10.5)
nRBC: 0.1 % (ref 0.0–0.2)

## 2020-05-02 LAB — C DIFFICILE (CDIFF) QUICK SCRN (NO PCR REFLEX)
C Diff antigen: NEGATIVE
C Diff interpretation: NOT DETECTED
C Diff toxin: NEGATIVE

## 2020-05-02 LAB — BASIC METABOLIC PANEL
Anion gap: 11 (ref 5–15)
BUN: 57 mg/dL — ABNORMAL HIGH (ref 6–20)
CO2: 18 mmol/L — ABNORMAL LOW (ref 22–32)
Calcium: 10.4 mg/dL — ABNORMAL HIGH (ref 8.9–10.3)
Chloride: 118 mmol/L — ABNORMAL HIGH (ref 98–111)
Creatinine, Ser: 1.2 mg/dL — ABNORMAL HIGH (ref 0.44–1.00)
GFR calc Af Amer: 57 mL/min — ABNORMAL LOW (ref >60)
GFR calc non Af Amer: 49 mL/min — ABNORMAL LOW (ref >60)
Glucose, Bld: 189 mg/dL — ABNORMAL HIGH (ref 70–99)
Potassium: 3.9 mmol/L (ref 3.5–5.1)
Sodium: 147 mmol/L — ABNORMAL HIGH (ref 135–145)

## 2020-05-02 LAB — MAGNESIUM: Magnesium: 1.9 mg/dL (ref 1.7–2.4)

## 2020-05-02 LAB — LACTOFERRIN, FECAL, QUALITATIVE: Lactoferrin, Fecal, Qual: NEGATIVE

## 2020-05-02 MED ORDER — DOCUSATE SODIUM 50 MG/5ML PO LIQD: 100.0000 mg | Freq: Two times a day (BID) | ORAL | Status: DC

## 2020-05-02 MED ORDER — ROCURONIUM BROMIDE 10 MG/ML (PF) SYRINGE
PREFILLED_SYRINGE | INTRAVENOUS | Status: AC
  Filled 2020-05-02: qty 10

## 2020-05-02 MED ORDER — ETOMIDATE 2 MG/ML IV SOLN
INTRAVENOUS | Status: AC
  Administered 2020-05-02: 10 mg
  Filled 2020-05-02: qty 20

## 2020-05-02 MED ORDER — SODIUM CHLORIDE 0.9 % IV SOLN: 250.0000 mL | INTRAVENOUS | Status: DC

## 2020-05-02 MED ORDER — NOREPINEPHRINE 4 MG/250ML-% IV SOLN
INTRAVENOUS | Status: AC
  Filled 2020-05-02: qty 250

## 2020-05-02 MED ORDER — FENTANYL CITRATE (PF) 100 MCG/2ML IJ SOLN
50.0000 ug | INTRAMUSCULAR | Status: DC | PRN
  Administered 2020-05-02 – 2020-05-04 (×2): 50 ug via INTRAVENOUS
  Filled 2020-05-02 (×2): qty 2

## 2020-05-02 MED ORDER — CHLORHEXIDINE GLUCONATE 0.12 % MT SOLN
15.0000 mL | Freq: Two times a day (BID) | OROMUCOSAL | Status: DC
  Administered 2020-05-02: 15 mL via OROMUCOSAL
  Filled 2020-05-02: qty 15

## 2020-05-02 MED ORDER — ORAL CARE MOUTH RINSE
15.0000 mL | Freq: Two times a day (BID) | OROMUCOSAL | Status: DC
  Administered 2020-05-02: 15 mL via OROMUCOSAL

## 2020-05-02 MED ORDER — FENTANYL CITRATE (PF) 100 MCG/2ML IJ SOLN
INTRAMUSCULAR | Status: AC
  Administered 2020-05-02: 100 ug
  Filled 2020-05-02: qty 2

## 2020-05-02 MED ORDER — SUCCINYLCHOLINE CHLORIDE 200 MG/10ML IV SOSY
PREFILLED_SYRINGE | INTRAVENOUS | Status: AC
  Filled 2020-05-02: qty 10

## 2020-05-02 MED ORDER — PIPERACILLIN-TAZOBACTAM 3.375 G IVPB
3.3750 g | Freq: Three times a day (TID) | INTRAVENOUS | Status: DC
  Administered 2020-05-02 – 2020-05-04 (×5): 3.375 g via INTRAVENOUS
  Filled 2020-05-02 (×5): qty 50

## 2020-05-02 MED ORDER — PROPOFOL 500 MG/50ML IV EMUL
INTRAVENOUS | Status: AC
  Filled 2020-05-02: qty 50

## 2020-05-02 MED ORDER — ORAL CARE MOUTH RINSE
15.0000 mL | OROMUCOSAL | Status: DC
  Administered 2020-05-02 – 2020-05-04 (×16): 15 mL via OROMUCOSAL

## 2020-05-02 MED ORDER — MIDAZOLAM HCL 2 MG/2ML IJ SOLN
INTRAMUSCULAR | Status: AC
  Administered 2020-05-02: 2 mg
  Filled 2020-05-02: qty 4

## 2020-05-02 MED ORDER — POLYETHYLENE GLYCOL 3350 17 G PO PACK: 17.0000 g | PACK | Freq: Every day | ORAL | Status: DC

## 2020-05-02 MED ORDER — PHENYLEPHRINE 40 MCG/ML (10ML) SYRINGE FOR IV PUSH (FOR BLOOD PRESSURE SUPPORT)
PREFILLED_SYRINGE | INTRAVENOUS | Status: AC
  Administered 2020-05-02: 200 ug
  Filled 2020-05-02: qty 10

## 2020-05-02 MED ORDER — PHENYLEPHRINE 40 MCG/ML (10ML) SYRINGE FOR IV PUSH (FOR BLOOD PRESSURE SUPPORT): 80.0000 ug | PREFILLED_SYRINGE | Freq: Once | INTRAVENOUS | Status: DC | PRN

## 2020-05-02 MED ORDER — FENTANYL CITRATE (PF) 100 MCG/2ML IJ SOLN: 50.0000 ug | INTRAMUSCULAR | Status: DC | PRN

## 2020-05-02 MED ORDER — DEXMEDETOMIDINE HCL IN NACL 200 MCG/50ML IV SOLN
0.0000 ug/kg/h | INTRAVENOUS | Status: DC
  Administered 2020-05-02 (×2): 0.4 ug/kg/h via INTRAVENOUS
  Administered 2020-05-03: 0.58 ug/kg/h via INTRAVENOUS
  Administered 2020-05-03 – 2020-05-04 (×2): 0.4 ug/kg/h via INTRAVENOUS
  Filled 2020-05-02 (×6): qty 50

## 2020-05-02 MED ORDER — CHLORHEXIDINE GLUCONATE 0.12% ORAL RINSE (MEDLINE KIT)
15.0000 mL | Freq: Two times a day (BID) | OROMUCOSAL | Status: DC
  Administered 2020-05-02 – 2020-05-04 (×4): 15 mL via OROMUCOSAL

## 2020-05-02 MED ORDER — DIPHENOXYLATE-ATROPINE 2.5-0.025 MG/5ML PO LIQD
5.0000 mL | Freq: Two times a day (BID) | ORAL | Status: AC
  Administered 2020-05-02 – 2020-05-03 (×3): 5 mL
  Filled 2020-05-02 (×3): qty 5

## 2020-05-02 MED ORDER — SODIUM CHLORIDE 0.45 % IV BOLUS
500.0000 mL | Freq: Once | INTRAVENOUS | Status: AC
  Administered 2020-05-02: 500 mL via INTRAVENOUS

## 2020-05-02 MED ORDER — NOREPINEPHRINE 4 MG/250ML-% IV SOLN
2.0000 ug/min | INTRAVENOUS | Status: DC
  Administered 2020-05-02: 2 ug/min via INTRAVENOUS
  Administered 2020-05-03: 9 ug/min via INTRAVENOUS
  Administered 2020-05-03: 7 ug/min via INTRAVENOUS
  Administered 2020-05-04: 6 ug/min via INTRAVENOUS
  Administered 2020-05-04 – 2020-05-05 (×2): 5 ug/min via INTRAVENOUS
  Filled 2020-05-02 (×5): qty 250

## 2020-05-02 NOTE — Evaluation (Addendum)
Clinical/Bedside Swallow Evaluation Patient Details  Name: Melissa Kent MRN: [id number] Date of Birth: 1961-07-19  Today's Date: 05/02/2020 Time: SLP Start Time (ACUTE ONLY): 1225 SLP Stop Time (ACUTE ONLY): 1240 SLP Time Calculation (min) (ACUTE ONLY): 15 min  Past Medical History:  Past Medical History:  Diagnosis Date  . Acute kidney injury ([place])   . Hypertension    Past Surgical History:  Past Surgical History:  Procedure Laterality Date  . ESOPHAGOGASTRODUODENOSCOPY (EGD) WITH PROPOFOL N/A 04/22/2020   Procedure: ESOPHAGOGASTRODUODENOSCOPY (EGD) WITH PROPOFOL;  Surgeon: Clarene Essex, MD;  Location: [facility] END[facility];  Service: Endoscopy;  Laterality: N/A;   HPI:  59 yo female adm to [facility] with ETOH cirrhoosis and ascites found ot have coffee ground emesis.  Pt with drop in HG, hyponatremic and with AKI, underwent EGD showing severe esophagitis.  Severe weakness, FTT noted.  Pt was intubated 7/31-8/6.  Large bore tube in place.  Swallow eval ordered.  Spouse present.   Assessment / Plan / Recommendation Clinical Impression  SLP visit with pt and spouse to discuss and initiate swallow evaluation.  Interview re: premorbid abilities took place as pt clearly with very low weight and suspected weakness. Spouse reports and pt confirms that pt had difficulties with food/drink coming back up after she swallowed.  [name] denies pt having problems getting pills down but pt does admit with head nods that she sensed pills sticking in her throat.  Voice was normal prior to admission per spouse.  Gross amount of secretions retained in her oral cavity without pt abiilty to protrude her tongue to labial level!   Discussed with pt and spouse concerns regarding pt's swallowing ability/airway protection explaining lack of phonation and grossly weak cough are negative indicators.  RR up to 41 at baseline, causing concern for swallowing/breathing reciprocity.  In addition, minimal lingual movement and inability to  clear and lack of awareness of oral secretions concerning.   Copious viscous secretions noted in oral cavity =  therefore SLP raised HOB and left to obtain suction kit to clean mouth to improve pt's lingual movement.  Upon return, pt desating, placed oxygen on pt (was turned on at the wall) and she sats rose for a short amount of time.  RN arrived to room per SLP request and pt was desaturating further with RR up to 40s.  Facemask placed by RN as pt unable to seal her lips.  SLP did not provided oral care due to pt's respiratory issues, requested RN to provide oral care when pt stablized.  Charge nurse called Dr Wynelle Cleveland re: concerns.    SLP suspects pt may have aspirated secretions with HOB elevation.  Advised pt and her spouse to this event again raising significant concerns for pt's ability to swallow and advised they consider GOC if pt is unable to swallow safely.   SLP Visit Diagnosis: Dysphagia, oropharyngeal phase (R13.12)    Aspiration Risk  Severe aspiration risk;Risk for inadequate nutrition/hydration    Diet Recommendation NPO (please provide frequent oral care)   Medication Administration: Via alternative means    Other  Recommendations Oral Care Recommendations: Oral care QID   Follow up Recommendations        Frequency and Duration   n/a         Prognosis Prognosis for Safe Diet Advancement: Guarded (poor)      Swallow Study   General Date of Onset: 05/02/20 HPI: 59 yo female adm to [facility] with ETOH cirrhoosis and ascites found ot have coffee ground  emesis.  Pt with drop in HG, hyponatremic and with AKI, underwent EGD showing severe esophagitis.  Severe weakness, FTT noted.  Pt was intubated 7/31-8/6.  Large bore tube in place.  Swallow eval ordered.  Spouse present. Type of Study: Bedside Swallow Evaluation Previous Swallow Assessment: endoscopy showed severe esophagitis Diet Prior to this Study: NPO Temperature Spikes Noted: No Respiratory Status: Room air (room air  initially, upon sitting pt up in bed, she demonstrated desaturation into 70's, placed oxygen on pt and RN arrived) History of Recent Intubation: Yes Length of Intubations (days): 7 days Date extubated: 04/29/20 Behavior/Cognition: Alert;Other (Comment) (open mouth posture) Oral Cavity Assessment: Dried secretions Oral Care Completed by SLP: Other (Comment) (SLP was setting up for oral care to determine if would improve lingual movement) Oral Cavity - Dentition: Poor condition Self-Feeding Abilities: Other (Comment) Patient Positioning: Upright in bed Baseline Vocal Quality: Not observed (pt makes attempts but no phonation heard) Volitional Cough: Weak Volitional Swallow: Unable to elicit    Oral/Motor/Sensory Function Overall Oral Motor/Sensory Function: Generalized oral weakness (pt is grossly weak with minimal lingual movement, inability to close her mouth)   Ice Chips Ice chips: Not tested   Thin Liquid Thin Liquid: Not tested    Nectar Thick Nectar Thick Liquid: Not tested   Honey Thick Honey Thick Liquid: Not tested   Puree Puree: Not tested   Solid     Solid: Not tested      Macario Golds 05/02/2020,1:44 PM  Kathleen Lime, MS [place] Office [phone]

## 2020-05-02 NOTE — TOC Progression Note (Signed)
Transition of Care [facility]) - Progression Note    Patient Details  Name: Melissa Kent MRN: [id number] Date of Birth: 1961/04/10  [facility] CM/SW Contact  Leeroy Cha, RN Phone Number: 05/02/2020, 10:46 AM  Clinical Narrative:    Goals of care meeting with the husband planned for this am at 10am.  Patient does not speak but does nod head.   Expected Discharge Plan: Home/Self Care Barriers to Discharge: Continued Medical Work up  Expected Discharge Plan and Services Expected Discharge Plan: Home/Self Care   Discharge Planning Services: CM Consult   Living arrangements for the past 2 months: Apartment                                       Social Determinants of Health (SDOH) Interventions    Readmission Risk Interventions No flowsheet data found.

## 2020-05-02 NOTE — Progress Notes (Addendum)
Daily Progress Note   Patient Name: Melissa Kent       Date: 05/02/2020 DOB: 1961/09/13  Age: 59 y.o. MRN#: [id number] Attending Physician: Debbe Odea, MD Primary Care Physician: Janie Morning, DO Admit Date: 04/01/2020  Reason for Consultation/Follow-up: Establishing goals of care  Subjective: Pt seen this morning with husband at bedside. Pt nods to having pain indicating in her back but remains non-verbal.  We had a more extensive conversation with her husband in the conference room. He continues to be hopeful and is concerned about loosing her.  He notes that she has had hesitancy about trusting the healthcare system in the past. He shares some of these feelings as well. In terms of her minimum quality of life she would tolerate, he notes that her independence is important to her. Prior to coming to the hospital this admission, he was having to help her get up from the bed to the bathroom and patient had implied she did not want to live that way.  I have reviewed medical records including EPIC notes, labs and imaging, received report from Dr. Wynelle Cleveland, examined the patient and met at bedside with patient's spouse  to discuss diagnosis prognosis, GOC, EOL wishes, disposition and options.  I introduced Palliative Medicine as specialized medical care for people living with serious illness. It focuses on providing relief from the symptoms and stress of a serious illness.   We discussed a brief life review of the patient. She and Melissa Kent have been together since 2007, married in 2009. She has 2 sons. Her career has been focused on homemaking. She has always enjoyed spending time with her family, travel. She is Jehovah's Witness- but was not actively practicing.  As far as functional and nutritional  status - there has been significant decline over the last year- prior to this admission- she could ambulate only within the home- outside of that she was wheelchair bound. She required assistance with ADL's- she had started not wanting to bathe or get out of bed. She had expressed to Melissa Kent prior to admission that she was so ill, "this isn't living". She was bed to chair at home- sleeping most of the day.  We discussed her current illness and what it means in the larger context of her on-going co-morbidities.  Natural disease trajectory and expectations at EOL  were discussed. Melissa Kent notes that she would want to die in her own bed.  I attempted to elicit values and goals of care important to the patient. In the past she has not valued medical treatment- was reluctant to go to physician appointments. She always valued her independence. Would not want to live a life dependent for care.   The difference between aggressive medical intervention and comfort care was discussed in light of the patient's goals of care. We discussed that based on her state of health prior to this admission- and having had major changes in the last few months in the domains of function, nutrition, and cognition- there are concerns that she is heading in a trajectory towards end of life despite all interventions. Melissa Kent felt that he is receiving conflicting information from providers- he notes that he has been told by providers this admission that she will likely be able to return to a "normal" life.  This normalcy is defined as living independently at home.   Advanced directives, concepts specific to code status, artifical feeding and hydration, and rehospitalization were considered and discussed.   Hospice and Palliative Care services outpatient were explained and offered.  Questions and concerns were addressed.  The family was encouraged to call with questions or concerns.   Length of Stay: 9  Current  Medications: Scheduled Meds:  . chlorhexidine  15 mL Mouth Rinse BID  . Chlorhexidine Gluconate Cloth  6 each Topical Daily  . feeding supplement (PROSource TF)  45 mL Per Tube BID  . folic acid  1 mg Per Tube Daily  . free water  100 mL Per Tube Q6H  . hydrocortisone sod succinate (SOLU-CORTEF) inj  50 mg Intravenous Q12H  . insulin aspart  0-9 Units Subcutaneous Q4H  . mouth rinse  15 mL Mouth Rinse q12n4p  . midodrine  10 mg Per Tube TID WC  . multivitamin with minerals  1 tablet Per Tube Daily  . pantoprazole  40 mg Intravenous Q12H  . rifaximin  550 mg Oral BID  . thiamine  100 mg Intravenous Daily    Continuous Infusions: . sodium chloride 10 mL/hr at 05/02/20 0834  . feeding supplement (OSMOLITE 1.2 CAL) 1,000 mL (05/02/20 1146)    PRN Meds: acetaminophen **OR** acetaminophen, Gerhardt's butt cream, HYDROmorphone (DILAUDID) injection, [COMPLETED] LORazepam **FOLLOWED BY** [COMPLETED] LORazepam **FOLLOWED BY** [COMPLETED] LORazepam **FOLLOWED BY** LORazepam    Vital Signs: BP 106/67   Pulse (!) 109   Temp 98.9 F (37.2 C) (Axillary)   Resp (!) 36   Ht 5' 2" (1.575 m)   Wt 60.8 kg   SpO2 91%   BMI 24.52 kg/m  SpO2: SpO2: 91 % O2 Device: O2 Device: Room Air O2 Flow Rate: O2 Flow Rate (L/min): 2 L/min   General: cachetic appearing critically ill female- has the appearance of someone who is dying HEENT: NG. Poor dentition. Temporal wasting Pulm: tachypneic.  Abd: mild distension. Peritoneal drain present on right. nontender to palpation  Intake/output summary:   Intake/Output Summary (Last 24 hours) at 05/02/2020 1217 Last data filed at 05/02/2020 0834 Gross per 24 hour  Intake 1081.84 ml  Output 1100 ml  Net -18.16 ml   LBM: Last BM Date: 05/01/20 Baseline Weight: Weight: 52.2 kg Most recent weight: Weight: 60.8 kg       Palliative Assessment/Data:10%      Patient Active Problem List   Diagnosis Date Noted  . Pressure injury of skin 05/02/2020  .  Advanced care planning/counseling discussion   .   Goals of care, counseling/discussion   . Palliative care by specialist   . Septic shock ([place])   . Aspiration pneumonia ([place])   . Protein-calorie malnutrition, severe 04/25/2020  . Acute kidney injury ([place])   . Acute respiratory failure ([place])   . Aspiration into airway   . Upper GI bleed 03/27/2020  . History of alcohol use disorder 04/21/2020  . Ascites 03/26/2020  . Hyponatremia 04/08/2020  . Leukocytosis 04/29/2018    Palliative Care Assessment & Plan   Patient Profile: 59 y.o. female  with past medical history of alcoholic cirrhosis with ascites, HTN, neutrophilia admitted on 04/15/2020 with 3 days of hematemesis. She was hypotensive, had increased WBC and ascites. EGD was performed and afterward patient required intubation, and IV pressors due to shock. EGD findings showed esophagitis and gastritis. RUQ u/s performed on 7/31 showed large ascites and concern for mass, however, no mass noted on MRI of abdomen. Further workup revealed septic shock, pneumonia, with sputum culture positive for rare Eikenella Corrodens. On 8/4 had EKG changes indicating STEMI- not able to anticoagulate due to GI bleeding. Off pressors and extubated on 8/6. Currently has large bore feeding tube in place, unable to complete bedside swallow eval due to weakness. Palliative medicine consulted for goals of care.    Assessment/Recommendations/Plan   Continue full scope care  Her husband remains hopeful for her to regain some strength. We discussed how a major part of this will entail improved nutrition and given her anorexia, will require a feeding tube. We discussed temporary feeding tube vs PEG and what the goals would be  Recommend that providers continue to communicate honestly giving realistic information regarding patient's prognosis for recovery to independent state, which is very unlikely when taking into account her overall state of health and multiple  comorbidities.   Code status was discussed- spouse prefers to keep patient full code for now- his current [place] are that patient can regain the ability to speak and make these decisions for herself.  Encouaraged Melissa Kent to continue to look at overall picture and consider patient's values and goals when making healthcare related decisions.  Goals of Care and Additional Recommendations:  Limitations on Scope of Treatment: Full Scope Treatment  Code Status:  DNR  Prognosis:   Unable to determine  Discharge Planning:  To Be Determined  Care plan was discussed with husband.  Thank you for allowing the Palliative Medicine Team to assist in the care of this patient.   Mitzi Hansen, MD Internal Medicine Resident PGY-2 [facility] Pager: [phone] 05/02/2020 12:42 PM   Mariana Kaufman, NP Palliative Medicine  Total time: 120 minutes     Please contact Palliative Medicine Team phone at [phone] for questions and concerns.

## 2020-05-02 NOTE — Progress Notes (Signed)
Pharmacy Antibiotic Note  Melissa Kent is a 59 y.o. female with a h/o ETOH cirrhosis admitted on 03/25/2020 with coffee-ground emesis, hyponatremia, and AKI.  Pharmacy has been  consulted for zosyn for aspiration PNA.   Plan: Zosyn 3.375 gm IV q8h, infuse each dose over 4 hours  Height: 5\' 2"  (157.5 cm) Weight: 60.8 kg (134 lb 0.6 oz) IBW/kg (Calculated) : 50.1  Temp (24hrs), Avg:99 F (37.2 C), Min:98.2 F (36.8 C), Max:100.1 F (37.8 C)  Recent Labs  Lab 04/27/20 1630 04/28/20 0436 04/28/20 1102 04/29/20 0229 04/30/20 0618 05/01/20 0122 05/02/20 1125  WBC  --  24.2*  --  24.5* 26.5* 30.9* 32.3*  CREATININE   < >  --  0.91 0.97 0.86 0.96 1.20*   < > = values in this interval not displayed.    Estimated Creatinine Clearance: 42.8 mL/min (A) (by C-G formula based on SCr of 1.2 mg/dL (H)).    No Known Allergies   Antimicrobials this admission: 7/31 CTX x 1 8/1 Unasyn >> 8/7  8/1 vancomycin >> 8/2 8/9 zosyn >> Dose adjustments this admission: 8/2: change unasyn from 3g q12 to 3g q6 due to improved SCr   Microbiology results: 8/1 BCx: ngF 7/31 peritoneal fluid: ngf 8/1 TA:  pending - rare GPC pairs, rare GNR -Eiknella corrodens - per culture note, usually susceptible to PCN 7/31 MRSA PCR: neg 8/9 GI panel: sent 8/9 C.diff panel:neg   Thank you for allowing pharmacy to be a part of this patient's care.  Eudelia Bunch, Pharm.D 05/02/2020 4:33 PM

## 2020-05-02 NOTE — Progress Notes (Signed)
NAME:  Donda Friedli, MRN:  [id number], DOB:  03/27/1961, LOS: 9 ADMISSION DATE:  04/13/2020, CONSULTATION DATE:  05/02/2020  REFERRING MD:  Benny Lennert, [facility] CHIEF COMPLAINT: Respiratory distress post EGD  Brief History   59 year old with EtOH cirrhosis and ascites admitted with coffee-ground emesis, drop in hemoglobin, hyponatremia and AKI, underwent EGD which showed severe esophagitis , required intubation in PACU postprocedure. Developed progressive shock post intubation/sedation  We had signed off the patient about 2 days ago Was asked to see you today secondary to respiratory distress Possible aspiration  Past Medical History  EtOH cirrhosis, ascites Leukocytosis  Significant Hospital Events   7/31 EGD showed esophagitis. Also had paracentesis removed 4 liters. Intubated in PACU d/t shock state resp  8/1: Breakthrough agitation on low-dose Precedex and intermittent fentanyl, CVP remains low 8/2: worsening shock over last 24 hrs. On levophed, vasopressin and phenylephrine. CVP-->still low. hgb 7.4 ->2 units total on 7/31 and 8/1, peaked at 9.2-->down again to 7.8-->transfusing 3rd unit for active shock  8/3: Vasoactive drip requirements improved, awake, following commands, still fairly tachypneic but chest x-ray improved off phenylephrine, still on norepinephrine as well as vasopressin, developed new atrial fibrillation with rapid ventricular response and was placed on amiodarone infusion 8/4: Clinically looks about the same.  Still on vasoactive drips.  Not able to wean much.  Developed some ST changes in anterior leads, denied chest pain, had no hemodynamic changes, no worsening work of breathing 8/5 off vasopressin. Levophed req better. ECHO w/ nml EF but gd II diastolic dysfxn. Giving lasix. Stopping fent gtt.  8/6: Off all pressors.  Amiodarone discontinued.  Off sedating drips.  Past spontaneous breathing trial, chest x-ray better following diuresis.  Successfully extubated. Consults:    GI  Procedures:  EGD 7/31 severe esophagitis, no varices 7/31 4 L paracentesis ETT 7/31 >>8/6 LIJ 7/31 >>   Significant Diagnostic Tests:  CT renal stone study 7/30 >> small bilateral effusions, cirrhosis, small hiatal hernia, large ill-defined hypodense liver masses, large ascites RUQ ultrasound 7/31 >> large ascites, liver mass not well visualized MRI abdomen 7/31 >> no clear mass identified Echocardiogram 8/4>>>Left ventricular ejection fraction, by estimation, is 55 to 60%. The left ventricle has normal function. The left ventricle has no regional wall motion abnormalities. Left ventricular diastolic parameters are  consistent with Grade II diastolic dysfunction (pseudonormalization). Elevated left atrial pressure.  2. Right ventricular systolic function is normal. The right ventricular size is normal. There is normal pulmonary artery systolic pressure. 3. Left atrial size was severely dilated. 4. Right atrial size was moderately dilated.  5. The mitral valve is grossly normal. Moderate to severe mitral valve regurgitation. No evidence of mitral stenosis.  6. Tricuspid valve regurgitation is severe. 7. The aortic valve is tricuspid. Aortic valve regurgitation is mild to  moderate. Mild aortic valve sclerosis is present, with no evidence of aortic valve stenosis.  Micro Data:  Blood 8/1 >> Ascitic fluid 7/31 >>ng Sputum culture 8/1: Rare EIKENELLA CORRODENS typically sens to PCN) Antimicrobials:  ceftx 7/31 >> 8/1 Unasyn 8/1>>> Zosyn 8/9>> Interim history/subjective:  Respirator distress Breathing in the high 40s, tachycardic to 120s on 140s, profoundly weak Saturations of low 80s  Objective   Blood pressure 113/68, pulse (!) 114, temperature 99.2 F (37.3 C), temperature source Axillary, resp. rate (!) 39, height 5\' 2"  (1.575 m), weight 60.8 kg, SpO2 91 %.        Intake/Output Summary (Last 24 hours) at 05/02/2020 1750 Last data filed at 05/02/2020 [phone]  Gross per 24  hour  Intake 1031.84 ml  Output 400 ml  Net 631.84 ml   Filed Weights   04/30/20 0500 05/01/20 0438 05/02/20 0500  Weight: 66.2 kg 65 kg 60.8 kg    Examination: Chronically ill-appearing Dry oral mucosa Rhonchi bilaterally Atrial fibrillation, S1-S2 appreciated Bowel sounds appreciated Extremities-no clubbing, she does have edema  Unable to move lower extremities Able to move right upper extremity   Resolved Hospital Problem list    Liver mass -CT showed hypodense liver masses -Abdominal MRI neg  acute kidney injury Anion gap metabolic acidosis  Septic shock (resolved and off pressors as of 8/5) Hyponatremia   Assessment & Plan:   Acute hypoxic respiratory failure Secondary to possible aspiration  -I did call the patient's spouse to discuss ongoing issues-very frail, guarded prognosis, multiple comorbidities -Decision was made to intubate patient for acute hypoxemic respiratory failure -Intubated with a 7.5 endotracheal tube -Tube had to be changed over bougie for a blown cough  Sepsis 2/2 aspiration pneumonia , no evidence of SBP Was off pressors Plan Continue Zosyn at present Will give 500 cc of half saline   New atrial fibrillation with rapid ventricular response, also ST changes in anterior leads Diastolic heart failure  -Not a candidate for anticoagulation given recent life-threatening bleeding, nor is she a candidate for left heart cath -echo w/out evidence of WM abnormality. Did have grade II diastolic dysfxn but systolic fxn intact  Plan Cont tele Dc amiodarone Not candidate for [facility]  Acute metabolic encephalopathy: T -Very frail Plan Sedation as needed for ventilator Cont ativan taper  Cont lactulose   Anasarca -Secondary to third spacing from malnutrition  UGI bleed with acute blood loss anemia-severe esophagitis on EGD; hemoglobin has drifted, see above Plan PPI Resume tube feeds.   EtOH cirrhosis with ascites -Transudative, status  post paracentesis -[place] eval at bedside 5/6: mod amt of ascites.  Plan Intermittent LFTs   Best practice:  Diet: tubefeeds  Pain/Anxiety/Delirium protocol (if indicated): Precedex/fent, goal RASS -1 VAP protocol (if indicated): y DVT prophylaxis: SCDs GI prophylaxis: protonix  Glucose control: SSI Mobility: BR Code Status: full Family Communication: spouse, [name] Disposition: ICU  The patient is critically ill with multiple organ systems failure and requires high complexity decision making for assessment and support, frequent evaluation and titration of therapies, application of advanced monitoring technologies and extensive interpretation of multiple databases. Critical Care Time devoted to patient care services described in this note independent of APP/resident time (if applicable)  is 40 minutes.   Sherrilyn Rist MD [facility] Personal pager: [phone] If unanswered, please page CCM On-call: [phone]

## 2020-05-02 NOTE — Progress Notes (Addendum)
PROGRESS NOTE    Melissa Kent   YKD:[id number]  DOB: [birth year]  DOA: 03/25/2020 PCP: Janie Morning, DO   Brief Narrative:  Melissa Kent is a 59 y/o with alcohol abuse, tobacco abuse, HTN, cirrhosis, ascites who presented with coffee ground emesis x 3 days and generalized weakness.  In the ED> tachycardic, hypotensive, sodium 123, WBC 20.8   EGD 7/31> severe reflux esophagitis RUQ ultrasound 7/31 >> large ascites, liver mass  - Intubated post EGD  -MRI abdomen> cirrhosis and large volume ascites -  4 L paracentesis done - ascitic fluid negative for infection  8/1: Rare EIKENELLA CORRODENS in sputum culture- Ceftriaxone changed to Unasyn 8/2: worsening shock- on pressors - receiving blood 8/3 developed new A-fib- placed on Amio infusion 8/5 off vasopressin- Still on Levophed - ECHO w/ nml EF but gd II diastolic dysfxn. Giving lasix. Stop fent gtt.  8/6: Off all pressors, had adequate diuresis, extubated- Still on tube feeds, liquid stool in flexiseal, moderate ascites in abdomen  Initial exam> 8/8- She has an NG tube in her left nares with tube feeds running, a rectal tube with liquid dark stool and a urostomy with clear urine.  She mostly does not speak and when she does, she response with a singe word which is barely above a whisper and difficult to decipher. It is dfficult to tell if she is oriented. She is laying in bed, does not move extremities, does not follow commands and keeps her mouth slightly ajar. She grimaces when abdomen is palpated.   Subjective: Still does not try to communicate when asked questions. Eyes are open and mouth wide open.     Assessment & Plan:   Principal Problem:   Upper GI bleed- anemia due to acute blood loss - related to erosive esophagitis - Hb dropped from 8.1 to 7.8 - currently 10-11 range - hematemesis has resolved- stool is dark but not black - cont BID PPI    Active Problems:   Fever (100.1 today), increasing Leukocytosis with  diarrhea - treatment for pneumonia completed on 8/8 tachycardic and tachypneic today -leukocytosis in setting of stress dose steroid use- weaning steroids but WBC rising and now are in 30s - 8/8> changed Lactulose to Xifaxin (last dose was 8/7) but she had 500 cc of stool in bag even after stopping Lactulose - ? C diff colitis-sending stool studies today  Bilateral infiltrates- CHF and suspected pneumonia Acute respiratory failure requiring extubation Septic shock  - likely aspiration due to hematemesis- Eikenella Corroden's in Sputum culture - BNP > 45,000- given diuretics and has diuresed - stop Aldactone today and allow sodium to drift down from 150 - now on room air - completes 7 days of Unasyn today - wean Solumedrol which is 50 Q6- weaned to 50 Q 12- will wean to 50 mg daily today - cont Midodrine at current dose    Acute diastolic CHF - ECHO > Grade 2 d CHF with mod-severe MVR - see above about Aldactone  A-fib with RVR - s/p Amiodarone infusion which has been stopped- in NSR now- ECHO noted below  Fluid overload followed by Hypernatremia and dehydration  Third spacing due to malnutrion - pitting edema up to thighs- per notes, her legs were swollen at home as well   -  Given diuretics for fluid overload- Sodium 150, Cl 119 therefore becoming intravascularly depleted now > hold diuretics for now and allow sodium to improve - start a small amount of free water per tube - follow  I and O and daily weights   Protein-calorie malnutrition, severe - severe edema of legs up to the groin due to 3rd spacing - currently on NG tube feeds as she has not been able to swallow -  tube feeds at goal at 60 cc  - I am assuming the NG was placed on 7/31- I see no procedure note- will need to change to Dobhoff if she cannot swallow in the next 1-2 days   Alcohol use leading to cirrhosis with ascites, mildly elevated LFTs and elevated INR -cont thiamine, folic acid and MVI - s/p paracentesis as  mentioned above - currently has mild- to moderate ascites - She was on Lactulose BID when I received her from PCCM- due to diarrhea, I changed this to Xifaxin- Upon talking to husband, it does not appear she has ever had hepatic encephalopathy- Will consider stopping Xifaxin once acute issues resolve     Large ill-defined hypodense liver masses, concerning for primary hepatic neoplasm - MRI abdomen on 7/31 did not find any cancer    Prognosis - based on her level of weakness (needs a hoyer lift),her severe malnutrition, anasarca, her inability to swallow and her current dependence on tube feeds, her inability to communicate, her severity of cirrhosis, I feel her prognosis is poor.  Prior to being admitted, her husband states, she would spend most of her day siting on the couch and he was worried that her muscles were atrophying. Her husband tells me today that he sees her going home with him and walking in the garden with him but maybe after a stay at rehab. He is certain that she will make a full recovery. He does not want to discuss a plan in case she does not completely improve to her baseline.  I have consulted palliative care.   Addendum: contacted by RN and by speech therapist- the speech therapist went to assess her, elevated the head of bed and went out to get supplies, she found the patient hypoxic when she returned and non rebreather had to be placed. CXR start ordered- unchanged from prior.  Now down to 2 L. Having some SVT.   Time spent in minutes: 70  9:15- 10 AM- I spoke with husband in detail, spoke with palliative care and with RN  &, reviewed prior notes. DVT prophylaxis: SCDs Code Status: Full code Family Communication: Husband at bedside Disposition Plan:  Status is: Inpatient  Remains inpatient appropriate because:unastable patient with tube feeds and multiple medical issues   Dispo: The patient is from: Home              Anticipated d/c is to: TBD               Anticipated d/c date is: > 3 days              Patient currently is not medically stable to d/c.  Consultants:   GI  PCCM  Palliative care Procedures:  1. Left ventricular ejection fraction, by estimation, is 55 to 60%. The  left ventricle has normal function. The left ventricle has no regional  wall motion abnormalities. Left ventricular diastolic parameters are  consistent with Grade II diastolic  dysfunction (pseudonormalization). Elevated left atrial pressure.  2. Right ventricular systolic function is normal. The right ventricular  size is normal. There is normal pulmonary artery systolic pressure.  3. Left atrial size was severely dilated.  4. Right atrial size was moderately dilated.  5. The mitral valve is grossly  normal. Moderate to severe mitral valve  regurgitation. No evidence of mitral stenosis.  6. Tricuspid valve regurgitation is severe.  7. The aortic valve is tricuspid. Aortic valve regurgitation is mild to  moderate. Mild aortic valve sclerosis is present, with no evidence of  aortic valve stenosis.   EGD NG tube Central line Intubation/ Extubation    Antimicrobials:  Anti-infectives (From admission, onward)   Start     Dose/Rate Route Frequency Ordered Stop   05/01/20 1100  rifaximin (XIFAXAN) tablet 550 mg     Discontinue     550 mg Oral 2 times daily 05/01/20 0930     04/26/20 2000  Ampicillin-Sulbactam (UNASYN) 3 g in sodium chloride 0.9 % 100 mL IVPB        3 g 200 mL/hr over 30 Minutes Intravenous Every 6 hours 04/26/20 1820 04/30/20 2116   04/25/20 1200  vancomycin (VANCOREADY) IVPB 500 mg/100 mL  Status:  Discontinued        500 mg 100 mL/hr over 60 Minutes Intravenous Every 24 hours 04/24/20 1014 04/25/20 1225   04/24/20 1130  Ampicillin-Sulbactam (UNASYN) 3 g in sodium chloride 0.9 % 100 mL IVPB  Status:  Discontinued        3 g 200 mL/hr over 30 Minutes Intravenous Every 12 hours 04/24/20 1014 04/26/20 1820   04/24/20 1130  vancomycin  (VANCOCIN) IVPB 1000 mg/200 mL premix        1,000 mg 200 mL/hr over 60 Minutes Intravenous  Once 04/24/20 1014 04/24/20 1331   04/07/2020 1600  cefTRIAXone (ROCEPHIN) 2 g in sodium chloride 0.9 % 100 mL IVPB  Status:  Discontinued        2 g 200 mL/hr over 30 Minutes Intravenous Every 24 hours 03/29/2020 1458 04/24/20 0940       Objective: Vitals:   05/02/20 0745 05/02/20 0800 05/02/20 0900 05/02/20 1000  BP:  118/81 122/66 120/66  Pulse: (!) 103 (!) 108 (!) 107 (!) 109  Resp: (!) 34 (!) 35 (!) 36 (!) 38  Temp:  98.9 F (37.2 C)    TempSrc:  Axillary    SpO2: 95% 94% 95% 94%  Weight:      Height:        Intake/Output Summary (Last 24 hours) at 05/02/2020 1029 Last data filed at 05/02/2020 0834 Gross per 24 hour  Intake 1281.84 ml  Output 1525 ml  Net -243.16 ml   Filed Weights   04/30/20 0500 05/01/20 0438 05/02/20 0500  Weight: 66.2 kg 65 kg 60.8 kg    Examination: General exam: Appears comfortable  HEENT: PERRLA, oral mucosa dry, no sclera icterus or thrush Respiratory system: Clear to auscultation. Respiratory effort normal. Cardiovascular system: S1 & S2 heard,  No murmurs  Gastrointestinal system: Abdomen soft,  Grimaces a little when palpated, moderately nondistended. Normal bowel sounds   Central nervous system: Alert - does not move arms or legs or try to speak. Extremities: No cyanosis, clubbing - 3 + pitting edema up to and including the thighs Skin: No rashes or ulcers Psychiatry:  flat affect   Data Reviewed: I have personally reviewed following labs and imaging studies  CBC: Recent Labs  Lab 04/26/20 1706 04/28/20 0436 04/29/20 0229 04/30/20 0618 05/01/20 0122  WBC 23.3* 24.2* 24.5* 26.5* 30.9*  HGB 8.3* 8.8* 9.5* 10.8* 11.5*  HCT 24.9* 26.3* 28.6* 32.7* 36.7  MCV 85.9 87.1 87.7 88.4 94.3  PLT 219 140* 96* 85* 109*   Basic Metabolic Panel: Recent Labs  Lab  04/25/20 1134 04/25/20 1134 04/25/20 1715 04/25/20 1715 04/26/20 0520 04/26/20 0520  04/26/20 1706 04/27/20 0515 04/27/20 1630 04/28/20 1102 04/29/20 0229 04/30/20 0618 05/01/20 0122  NA 131*   < > 130*   < > 134*   < > 136   < > 139 141 141 146* 150*  K 3.6   < > 3.9   < > 3.5   < > 3.4*   < > 3.8 3.2* 2.9* 4.6 4.0  CL 101   < > 101   < > 103   < > 107   < > 109 111 110 114* 119*  CO2 13*   < > 12*   < > 17*   < > 16*   < > 21* 19* 20* 18* 19*  GLUCOSE 136*   < > 181*   < > 175*   < > 217*   < > 203* 214* 171* 138* 171*  BUN 23*   < > 21*   < > 20   < > 19   < > 20 25* 27* 29* 38*  CREATININE 1.32*   < > 1.20*   < > 1.03*   < > 1.00   < > 0.83 0.91 0.97 0.86 0.96  CALCIUM 6.8*   < > 7.0*   < > 7.6*   < > 7.8*   < > 8.9 9.0 9.4 10.3 10.6*  MG 2.2  --  2.6*  --  2.3  --  2.0  --   --   --   --   --  2.1  PHOS 3.2  --  4.9*  --  3.1  --  2.2*  --   --   --   --   --  3.3   < > = values in this interval not displayed.   GFR: Estimated Creatinine Clearance: 53.5 mL/min (by C-G formula based on SCr of 0.96 mg/dL). Liver Function Tests: Recent Labs  Lab 04/26/20 1706 04/27/20 0515 04/27/20 1630 04/29/20 0229 04/30/20 0618  AST 26 23 21 21  53*  ALT 12 12 12 13 27   ALKPHOS 96 116 125 134* 282*  BILITOT 2.5* 2.3* 1.8* 1.4* 1.5*  PROT 5.7* 5.6* 5.8* 5.2* 5.7*  ALBUMIN 3.5 3.4* 3.3* 2.6* 2.7*   No results for input(s): LIPASE, AMYLASE in the last 168 hours. Recent Labs  Lab 04/27/20 0515  AMMONIA 68*   Coagulation Profile: Recent Labs  Lab 05/01/20 1538  INR 1.5*   Cardiac Enzymes: No results for input(s): CKTOTAL, CKMB, CKMBINDEX, TROPONINI in the last 168 hours. BNP (last 3 results) No results for input(s): PROBNP in the last 8760 hours. HbA1C: No results for input(s): HGBA1C in the last 72 hours. CBG: Recent Labs  Lab 05/01/20 1618 05/01/20 1947 05/01/20 2315 05/02/20 0325 05/02/20 0751  GLUCAP 175* 171* 184* 144* 161*   Lipid Profile: No results for input(s): CHOL, HDL, LDLCALC, TRIG, CHOLHDL, LDLDIRECT in the last 72 hours. Thyroid  Function Tests: No results for input(s): TSH, T4TOTAL, FREET4, T3FREE, THYROIDAB in the last 72 hours. Anemia Panel: Recent Labs    05/01/20 1538  VITAMINB12 774  FOLATE 13.9  FERRITIN 628*  TIBC 98*  IRON 84  RETICCTPCT 2.7   Urine analysis:    Component Value Date/Time   COLORURINE YELLOW 04/30/2020 1252   APPEARANCEUR CLEAR 04/30/2020 1252   LABSPEC 1.011 04/30/2020 1252   PHURINE 5.0 04/30/2020 1252   GLUCOSEU NEGATIVE 04/30/2020 1252   HGBUR NEGATIVE 04/30/2020  [place] 04/30/2020 1252   [place] 04/30/2020 1252   PROTEINUR NEGATIVE 04/30/2020 1252   NITRITE NEGATIVE 04/30/2020 1252   LEUKOCYTESUR NEGATIVE 04/30/2020 1252   Sepsis Labs: @LABRCNTIP (procalcitonin:4,lacticidven:4) ) Recent Results (from the past 240 hour(s))  SARS Coronavirus 2 by RT PCR (hospital order, performed in [facility] hospital lab) Nasopharyngeal Nasopharyngeal Swab     Status: None   Collection Time: 04/01/2020  1:10 AM   Specimen: Nasopharyngeal Swab  Result Value Ref Range Status   SARS Coronavirus 2 NEGATIVE NEGATIVE Final    Comment: (NOTE) SARS-CoV-2 target nucleic acids are NOT DETECTED.  The SARS-CoV-2 RNA is generally detectable in upper and lower respiratory specimens during the acute phase of infection. The lowest concentration of SARS-CoV-2 viral copies this assay can detect is 250 copies / mL. A negative result does not preclude SARS-CoV-2 infection and should not be used as the sole basis for treatment or other patient management decisions.  A negative result may occur with improper specimen collection / handling, submission of specimen other than nasopharyngeal swab, presence of viral mutation(s) within the areas targeted by this assay, and inadequate number of viral copies (<250 copies / mL). A negative result must be combined with clinical observations, patient history, and epidemiological information.  Fact Sheet for Patients:     StrictlyIdeas.no  Fact Sheet for Healthcare Providers: BankingDealers.co.za  This test is not yet approved or  cleared by the [place] FDA and has been authorized for detection and/or diagnosis of SARS-CoV-2 by FDA under an Emergency Use Authorization (EUA).  This EUA will remain in effect (meaning this test can be used) for the duration of the COVID-19 declaration under Section 564(b)(1) of the Act, 21 U.S.C. section 360bbb-3(b)(1), unless the authorization is terminated or revoked sooner.  Performed at [facility], [place] [address], [place], [place] [address]   MRSA PCR Screening     Status: None   Collection Time: 04/10/2020  3:10 PM   Specimen: Nasopharyngeal  Result Value Ref Range Status   MRSA by PCR NEGATIVE NEGATIVE Final    Comment:        The GeneXpert MRSA Assay (FDA approved for NASAL specimens only), is one component of a comprehensive MRSA colonization surveillance program. It is not intended to diagnose MRSA infection nor to guide or monitor treatment for MRSA infections. Performed at [facility], [place] [address], [place], [place] [address]   Body fluid culture     Status: None   Collection Time: 04/15/2020  5:00 PM   Specimen: Peritoneal Washings; Body Fluid  Result Value Ref Range Status   Specimen Description   Final    PERITONEAL Performed at [place] [address], [place], [place] [address]    Special Requests   Final    NONE Performed at [facility], [place] [address] [place], [place] [address]    Gram Stain   Final    RARE WBC PRESENT, PREDOMINANTLY MONONUCLEAR NO ORGANISMS SEEN    Culture   Final    NO GROWTH 3 DAYS Performed at [facility], [place] [address] [place], [place] [address]    Report Status 04/27/2020 FINAL  Final  Culture, blood (Routine X 2) w Reflex to ID Panel     Status: None   Collection  Time: 04/24/20  8:46 AM   Specimen: BLOOD  Result Value Ref Range Status   Specimen Description   Final  BLOOD BLOOD LEFT HAND Performed at [place] [address], [place], [place] [address]    Special Requests   Final    BOTTLES DRAWN AEROBIC ONLY Blood Culture results may not be optimal due to an inadequate volume of blood received in culture bottles Performed at [place] [address], [place], [place] [address]    Culture   Final    NO GROWTH 5 DAYS Performed at [facility], [place] [address] [place], [place] [address]    Report Status 04/29/2020 FINAL  Final  Culture, respiratory (non-expectorated)     Status: None   Collection Time: 04/24/20  9:40 AM   Specimen: Tracheal Aspirate; Respiratory  Result Value Ref Range Status   Specimen Description   Final    TRACHEAL ASPIRATE Performed at [place] [address] [place], [place] [address]    Special Requests   Final    NONE Performed at [facility], [place] [address] [place], [place] [address]    Gram Stain   Final    FEW WBC PRESENT,BOTH PMN AND MONONUCLEAR RARE GRAM POSITIVE COCCI IN PAIRS RARE GRAM NEGATIVE RODS    Culture   Final    RARE EIKENELLA CORRODENS Usually susceptible to penicillin and other beta lactam agents,quinolones,macrolides and tetracyclines. Performed at [facility], [place] [address] [place], [place] [address]    Report Status 04/27/2020 FINAL  Final         Radiology Studies: No results found.    Scheduled Meds: . chlorhexidine  15 mL Mouth Rinse BID  . Chlorhexidine Gluconate Cloth  6 each Topical Daily  . feeding supplement (PROSource TF)  45 mL Per Tube BID  . folic acid  1 mg Per Tube Daily  . free water  100 mL Per Tube Q6H  . hydrocortisone sod succinate (SOLU-CORTEF) inj  50 mg Intravenous Q12H  . insulin aspart  0-9 Units Subcutaneous Q4H  . mouth rinse  15 mL Mouth  Rinse q12n4p  . midodrine  10 mg Per Tube TID WC  . multivitamin with minerals  1 tablet Per Tube Daily  . pantoprazole  40 mg Intravenous Q12H  . rifaximin  550 mg Oral BID  . thiamine  100 mg Intravenous Daily   Continuous Infusions: . sodium chloride 10 mL/hr at 05/02/20 0834  . feeding supplement (OSMOLITE 1.2 CAL) 60 mL/hr at 05/02/20 0323     LOS: 9 days      Debbe Odea, MD [facility] Pager: [email or web address] 05/02/2020, 10:29 AM

## 2020-05-02 NOTE — Procedures (Signed)
Intubation Procedure Note  Melissa Kent  [id number]  May 20, 1961  Date:05/02/20  Time:5:48 PM   Provider Performing:Rayette Mogg A Makaylah Oddo    Procedure: Intubation (31500)  Indication(s) Respiratory Failure  Consent Unable to obtain consent due to emergent nature of procedure.   Anesthesia Etomidate, Versed, Fentanyl and Rocuronium  10 of etomidate, 2 of Versed, 50 of fentanyl. Rocuronium was held  Time Out Verified patient identification, verified procedure, site/side was marked, verified correct patient position, special equipment/implants available, medications/allergies/relevant history reviewed, required imaging and test results available.   Sterile Technique Usual hand hygeine, masks, and gloves were used   Procedure Description Patient positioned in bed supine.  Sedation given as noted above.  Patient was intubated with endotracheal tube using Glidescope.  View was Grade 2 only posterior commissure .  Number of attempts was 1.  Colorimetric CO2 detector was consistent with tracheal placement.  As tube was been secured, balloon noted not to be holding pressures  Patient was reintubated with a size 7.5 endotracheal tube over a bougie Good air entry bilaterally Good color change  Complications/Tolerance None; patient tolerated the procedure well. Chest X-ray is ordered to verify placement.   EBL none   Specimen(s) None

## 2020-05-03 DIAGNOSIS — J9690 Respiratory failure, unspecified, unspecified whether with hypoxia or hypercapnia: Secondary | ICD-10-CM

## 2020-05-03 DIAGNOSIS — K7031 Alcoholic cirrhosis of liver with ascites: Secondary | ICD-10-CM | POA: Diagnosis not present

## 2020-05-03 DIAGNOSIS — K922 Gastrointestinal hemorrhage, unspecified: Secondary | ICD-10-CM | POA: Diagnosis not present

## 2020-05-03 DIAGNOSIS — Z789 Other specified health status: Secondary | ICD-10-CM

## 2020-05-03 DIAGNOSIS — J690 Pneumonitis due to inhalation of food and vomit: Secondary | ICD-10-CM | POA: Diagnosis not present

## 2020-05-03 DIAGNOSIS — J9601 Acute respiratory failure with hypoxia: Secondary | ICD-10-CM | POA: Diagnosis not present

## 2020-05-03 LAB — CBC
HCT: 32.3 % — ABNORMAL LOW (ref 36.0–46.0)
Hemoglobin: 10.5 g/dL — ABNORMAL LOW (ref 12.0–15.0)
MCH: 29.7 pg (ref 26.0–34.0)
MCHC: 32.5 g/dL (ref 30.0–36.0)
MCV: 91.5 fL (ref 80.0–100.0)
Platelets: 128 10*3/uL — ABNORMAL LOW (ref 150–400)
RBC: 3.53 MIL/uL — ABNORMAL LOW (ref 3.87–5.11)
RDW: 20.1 % — ABNORMAL HIGH (ref 11.5–15.5)
WBC: 49.4 10*3/uL — ABNORMAL HIGH (ref 4.0–10.5)
nRBC: 0 % (ref 0.0–0.2)

## 2020-05-03 LAB — BASIC METABOLIC PANEL
Anion gap: 8 (ref 5–15)
BUN: 57 mg/dL — ABNORMAL HIGH (ref 6–20)
CO2: 20 mmol/L — ABNORMAL LOW (ref 22–32)
Calcium: 9.6 mg/dL (ref 8.9–10.3)
Chloride: 119 mmol/L — ABNORMAL HIGH (ref 98–111)
Creatinine, Ser: 1.25 mg/dL — ABNORMAL HIGH (ref 0.44–1.00)
GFR calc Af Amer: 54 mL/min — ABNORMAL LOW (ref >60)
GFR calc non Af Amer: 47 mL/min — ABNORMAL LOW (ref >60)
Glucose, Bld: 160 mg/dL — ABNORMAL HIGH (ref 70–99)
Potassium: 4 mmol/L (ref 3.5–5.1)
Sodium: 147 mmol/L — ABNORMAL HIGH (ref 135–145)

## 2020-05-03 LAB — BLOOD GAS, ARTERIAL
Acid-base deficit: 3.6 mmol/L — ABNORMAL HIGH (ref 0.0–2.0)
Bicarbonate: 18.4 mmol/L — ABNORMAL LOW (ref 20.0–28.0)
FIO2: 60
O2 Saturation: 95.8 %
Patient temperature: 98.6
pCO2 arterial: 24.7 mmHg — ABNORMAL LOW (ref 32.0–48.0)
pH, Arterial: 7.485 — ABNORMAL HIGH (ref 7.350–7.450)
pO2, Arterial: 81.5 mmHg — ABNORMAL LOW (ref 83.0–108.0)

## 2020-05-03 LAB — GASTROINTESTINAL PANEL BY PCR, STOOL (REPLACES STOOL CULTURE)

## 2020-05-03 LAB — PHOSPHORUS: Phosphorus: 3.5 mg/dL (ref 2.5–4.6)

## 2020-05-03 LAB — GLUCOSE, CAPILLARY
Glucose-Capillary: 139 mg/dL — ABNORMAL HIGH (ref 70–99)
Glucose-Capillary: 147 mg/dL — ABNORMAL HIGH (ref 70–99)
Glucose-Capillary: 153 mg/dL — ABNORMAL HIGH (ref 70–99)
Glucose-Capillary: 155 mg/dL — ABNORMAL HIGH (ref 70–99)
Glucose-Capillary: 170 mg/dL — ABNORMAL HIGH (ref 70–99)

## 2020-05-03 LAB — LACTIC ACID, PLASMA
Lactic Acid, Venous: 1.4 mmol/L (ref 0.5–1.9)
Lactic Acid, Venous: 1.6 mmol/L (ref 0.5–1.9)

## 2020-05-03 LAB — AFP TUMOR MARKER: AFP, Serum, Tumor Marker: 3.9 ng/mL (ref 0.0–8.3)

## 2020-05-03 LAB — MAGNESIUM: Magnesium: 1.5 mg/dL — ABNORMAL LOW (ref 1.7–2.4)

## 2020-05-03 MED ORDER — SODIUM CHLORIDE 0.9 % IV BOLUS
1000.0000 mL | Freq: Once | INTRAVENOUS | Status: AC
  Administered 2020-05-03: 1000 mL via INTRAVENOUS

## 2020-05-03 MED ORDER — VANCOMYCIN HCL IN DEXTROSE 1-5 GM/200ML-% IV SOLN
1000.0000 mg | INTRAVENOUS | Status: DC
  Administered 2020-05-03: 1000 mg via INTRAVENOUS
  Filled 2020-05-03: qty 200

## 2020-05-03 MED ORDER — LACTATED RINGERS IV SOLN: INTRAVENOUS | Status: DC

## 2020-05-03 MED ORDER — FREE WATER
200.0000 mL | Freq: Four times a day (QID) | Status: DC
  Administered 2020-05-03 – 2020-05-05 (×7): 200 mL

## 2020-05-03 MED ORDER — MAGNESIUM SULFATE 4 GM/100ML IV SOLN
4.0000 g | Freq: Once | INTRAVENOUS | Status: AC
  Administered 2020-05-03: 4 g via INTRAVENOUS
  Filled 2020-05-03: qty 100

## 2020-05-03 MED ORDER — VITAL AF 1.2 CAL PO LIQD
1000.0000 mL | ORAL | Status: DC
  Administered 2020-05-03 – 2020-05-04 (×2): 1000 mL

## 2020-05-03 MED ORDER — PROSOURCE TF PO LIQD
45.0000 mL | Freq: Every day | ORAL | Status: DC
  Administered 2020-05-04: 45 mL
  Filled 2020-05-03 (×2): qty 45

## 2020-05-03 NOTE — Progress Notes (Addendum)
Daily Progress Note   Patient Name: Melissa Kent       Date: 05/03/2020 DOB: 23-May-1961  Age: 59 y.o. MRN#: [id number] Attending Physician: Debbe Odea, MD Primary Care Physician: Janie Morning, DO Admit Date: 04/03/2020  Reason for Consultation/Follow-up: Establishing goals of care  Subjective:  Pt re-intubated yesterday evening 2/2 respiratory failure. Remains intubated this morning. Pressor dependent again as well with levo requirement slowly trending up--currently on 9.   Pt seen at bedside with Laurey Arrow [facility]) along with patient and her husband. They discussed that they will likely be able to extubate her again today however there is the ongoing concern for need of recurrent intubations at which point a tracheostomy would be indicated. They discussed that a recovery process would involves months of hard work and that success would not be guaranteed. Laurey Arrow encouraged them to consider what they would want done in the situation where she would require reintubation.   Following the discussion between [facility] and pt and her husband, we revisited code status. Husband indicates that he would prefer to wait for their sons to be present (planning to arrive tomorrow). Her husband did kindly ask that we continue discussions with them, along with PCCM.   Length of Stay: 10  Current Medications: Scheduled Meds:  . chlorhexidine gluconate (MEDLINE KIT)  15 mL Mouth Rinse BID  . Chlorhexidine Gluconate Cloth  6 each Topical Daily  . diphenoxylate-atropine  5 mL Per Tube BID  . docusate  100 mg Per Tube BID  . feeding supplement (PROSource TF)  45 mL Per Tube BID  . folic acid  1 mg Per Tube Daily  . free water  100 mL Per Tube Q6H  . hydrocortisone sod succinate (SOLU-CORTEF) inj  50 mg Intravenous Q12H    . insulin aspart  0-9 Units Subcutaneous Q4H  . mouth rinse  15 mL Mouth Rinse 10 times per day  . midodrine  10 mg Per Tube TID WC  . multivitamin with minerals  1 tablet Per Tube Daily  . pantoprazole  40 mg Intravenous Q12H  . polyethylene glycol  17 g Per Tube Daily  . rifaximin  550 mg Oral BID  . thiamine  100 mg Intravenous Daily    Continuous Infusions: . sodium chloride 10 mL/hr at 05/02/20 0834  . sodium chloride    .  dexmedetomidine (PRECEDEX) IV infusion 0.58 mcg/kg/hr (05/03/20 0533)  . feeding supplement (OSMOLITE 1.2 CAL) Stopped (05/02/20 1953)  . norepinephrine (LEVOPHED) Adult infusion 9 mcg/min (05/03/20 0532)  . piperacillin-tazobactam (ZOSYN)  IV Stopped (05/03/20 0728)    PRN Meds: acetaminophen **OR** acetaminophen, fentaNYL (SUBLIMAZE) injection, fentaNYL (SUBLIMAZE) injection, Gerhardt's butt cream, HYDROmorphone (DILAUDID) injection, [COMPLETED] LORazepam **FOLLOWED BY** [COMPLETED] LORazepam **FOLLOWED BY** [COMPLETED] LORazepam **FOLLOWED BY** LORazepam, phenylephrine    Vital Signs: BP 133/76   Pulse 79   Temp (!) 97 F (36.1 C) (Axillary)   Resp 19   Ht '5\' 2"'  (1.575 m)   Wt 60.8 kg   SpO2 99%   BMI 24.52 kg/m  SpO2: SpO2: 99 % O2 Device: O2 Device: Ventilator O2 Flow Rate: O2 Flow Rate (L/min): 2 L/min   General: cachetic appearing critically ill female- has the appearance of someone who is dying HEENT: ETT. NG. Poor dentition. Temporal wasting Pulm: on vent.  Abd: mild distension. Right sided colostomy.  Neuro: more alert this morning. Intermittently nods her head appropriately to questions.  Intake/output summary:   Intake/Output Summary (Last 24 hours) at 05/03/2020 1003 Last data filed at 05/03/2020 0700 Gross per 24 hour  Intake 888.36 ml  Output 1105 ml  Net -216.64 ml   LBM: Last BM Date: 05/02/20 (Flexiseal) Baseline Weight: Weight: 52.2 kg Most recent weight: Weight: 60.8 kg       Palliative  Assessment/Data:10%      Patient Active Problem List   Diagnosis Date Noted  . Pressure injury of skin 05/02/2020  . Advanced care planning/counseling discussion   . Goals of care, counseling/discussion   . Palliative care by specialist   . Septic shock ([place])   . Aspiration pneumonia ([place])   . Protein-calorie malnutrition, severe 04/25/2020  . Acute kidney injury ([place])   . Acute respiratory failure ([place])   . Aspiration into airway   . Upper GI bleed 04/17/2020  . History of alcohol use disorder 03/31/2020  . Ascites 04/05/2020  . Hyponatremia 04/22/2020  . Leukocytosis 04/29/2018    Palliative Care Assessment & Plan   Patient Profile: 59 y.o. female  with past medical history of alcoholic cirrhosis with ascites, HTN, neutrophilia admitted on 04/09/2020 with 3 days of hematemesis. She was hypotensive, had increased WBC and ascites. EGD was performed and afterward patient required intubation, and IV pressors due to shock. EGD findings showed esophagitis and gastritis. RUQ u/s performed on 7/31 showed large ascites and concern for mass, however, no mass noted on MRI of abdomen. Further workup revealed septic shock, pneumonia, with sputum culture positive for rare Eikenella Corrodens. On 8/4 had EKG changes indicating STEMI- not able to anticoagulate due to GI bleeding. Off pressors and extubated on 8/6. Currently has large bore feeding tube in place, unable to complete bedside swallow eval due to weakness. Palliative medicine consulted for goals of care.    Assessment/Recommendations/Plan   Continue full scope care  Palliative will continue to follow per husband's request  Will plan to meet tomorrow pending sons' arrival  Recommend that providers continue to communicate honestly giving realistic information regarding patient's prognosis for recovery to independent state, which is very unlikely when taking into account her overall state of health and multiple comorbidities.    Code status was discussed- spouse prefers to keep patient full code for now- his current [place] are that patient can regain the ability to speak and make these decisions for herself.  Goals of Care and Additional Recommendations:  Limitations on Scope  of Treatment: Full Scope Treatment  Code Status:  DNR  Prognosis:   Unable to determine  Discharge Planning:  To Be Determined  Care plan was discussed with husband.  Thank you for allowing the Palliative Medicine Team to assist in the care of this patient.  Mitzi Hansen, MD Internal Medicine Resident PGY-2 [facility] Pager: [phone] 05/03/2020 10:03 AM   Mariana Kaufman, NP Palliative Medicine  Total time: 35 minutes     Please contact Palliative Medicine Team phone at [phone] for questions and concerns.

## 2020-05-03 NOTE — Progress Notes (Signed)
SLP Cancellation Note  Patient Details Name: Melissa Kent MRN: [id number] DOB: Jul 29, 1961   Cancelled treatment:       Reason Eval/Treat Not Completed: Other (comment) (Pt now reintubated, SLP will sign off, please reorder if desire. Thanks.)  Kathleen Lime, MS [facility] SLP Acute Rehab Services Office [phone]  Macario Golds 05/03/2020, 7:31 AM

## 2020-05-03 NOTE — Progress Notes (Signed)
eLink Physician-Brief Progress Note Patient Name: Melissa Kent DOB: [birth year] MRN: [id number]   Date of Service  05/03/2020  HPI/Events of Note  Hypotension - Increasing Norepinephrine via PIV required to support BP. Last LVEF = 55% to 60%.  eICU Interventions  Plan: 1. Bolus with 0.9 NaCl 1 liter IV over 1 hour now.  2. ABG at 5 AM.      Intervention Category Major Interventions: Hypotension - evaluation and management  Liddy Deam [place] 05/03/2020, 1:53 AM

## 2020-05-03 NOTE — Progress Notes (Signed)
Pharmacy Antibiotic Note  Melissa Kent is a 59 y.o. female with a h/o ETOH cirrhosis admitted on 04/12/2020 with coffee-ground emesis, hyponatremia, and AKI.  Pharmacy has been  consulted for zosyn for aspiration PNA.   05/03/20  - Day #3 Zosyn - Add vancomycin - WBC 49.4 (hydrocortisone)  - Tmax 99.9   Plan: Vancomycin 1g IV q24h Consider checking levels as needed Zosyn 3.375 gm IV q8h, infuse each dose over 4 hours  Height: 5\' 2"  (157.5 cm) Weight: 60.8 kg (134 lb 0.6 oz) IBW/kg (Calculated) : 50.1  Temp (24hrs), Avg:98.8 F (37.1 C), Min:97 F (36.1 C), Max:99.9 F (37.7 C)  Recent Labs  Lab 04/29/20 0229 04/30/20 0618 05/01/20 0122 05/02/20 1125 05/03/20 0304  WBC 24.5* 26.5* 30.9* 32.3* 49.4*  CREATININE 0.97 0.86 0.96 1.20* 1.25*    Estimated Creatinine Clearance: 41.1 mL/min (A) (by C-G formula based on SCr of 1.25 mg/dL (H)).    No Known Allergies   Antimicrobials this admission: 7/31 CTX x 1 8/1 Unasyn >> 8/7  8/1 vancomycin >> 8/2, resume 8/10 >> 8/9 zosyn >>  Dose adjustments this admission: 8/2: change unasyn from 3g q12 to 3g q6 due to improved SCr   Microbiology results: 8/1 BCx: ngF 7/31 peritoneal fluid: ngf 8/1 TA:  pending - rare GPC pairs, rare GNR -Eiknella corrodens - per culture note, usually susceptible to PCN 7/31 MRSA PCR: neg 8/9 GI panel: neg 8/9 C.diff panel:neg 8/9 Trach aspirate:   Thank you for allowing pharmacy to be a part of this patient's care.  Peggyann Juba, PharmD, BCPS Pharmacy: [phone] 05/03/2020 11:38 AM

## 2020-05-03 NOTE — Progress Notes (Signed)
NAME:  Melissa Kent, MRN:  [id number], DOB:  08-06-1961, LOS: 10 ADMISSION DATE:  04/01/2020, CONSULTATION DATE:  05/03/2020  REFERRING MD:  Benny Lennert, [facility] CHIEF COMPLAINT: Respiratory distress post EGD  Brief History   59 year old with EtOH cirrhosis and ascites admitted with coffee-ground emesis, drop in hemoglobin, hyponatremia and AKI, underwent EGD which showed severe esophagitis , required intubation in PACU postprocedure. Developed progressive shock post intubation/sedation  We had signed off the patient about 2 days ago Was asked to see you today secondary to respiratory distress Possible aspiration  Past Medical History  EtOH cirrhosis, ascites Leukocytosis  Significant Hospital Events   7/31 EGD showed esophagitis. Also had paracentesis removed 4 liters. Intubated in PACU d/t shock state resp  8/1: Breakthrough agitation on low-dose Precedex and intermittent fentanyl, CVP remains low 8/2: worsening shock over last 24 hrs. On levophed, vasopressin and phenylephrine. CVP-->still low. hgb 7.4 ->2 units total on 7/31 and 8/1, peaked at 9.2-->down again to 7.8-->transfusing 3rd unit for active shock  8/3: Vasoactive drip requirements improved, awake, following commands, still fairly tachypneic but chest x-ray improved off phenylephrine, still on norepinephrine as well as vasopressin, developed new atrial fibrillation with rapid ventricular response and was placed on amiodarone infusion 8/4: Clinically looks about the same.  Still on vasoactive drips.  Not able to wean much.  Developed some ST changes in anterior leads, denied chest pain, had no hemodynamic changes, no worsening work of breathing 8/5 off vasopressin. Levophed req better. ECHO w/ nml EF but gd II diastolic dysfxn. Giving lasix. Stopping fent gtt.  8/6: Off all pressors.  Amiodarone discontinued.  Off sedating drips.  Past spontaneous breathing trial, chest x-ray better following diuresis.  Successfully extubated. 8/7:  Complete continuing stress dose steroids, diuresing.  Central line removed, speech therapy consulted, transition to internal medicine Palliative care team consulted for goals of care. 8/8: Very weak, still barely able to talk louder than whisper.  Not able to take p.o.'s 8/9: Aspiration event resulting in progressive and recurrent respiratory failure and ultimately intubation Consults:  GI  Procedures:  EGD 7/31 severe esophagitis, no varices 7/31 4 L paracentesis ETT 7/31 >>8/6 LIJ 7/31 >>   Significant Diagnostic Tests:  CT renal stone study 7/30 >> small bilateral effusions, cirrhosis, small hiatal hernia, large ill-defined hypodense liver masses, large ascites RUQ ultrasound 7/31 >> large ascites, liver mass not well visualized MRI abdomen 7/31 >> no clear mass identified Echocardiogram 8/4>>>Left ventricular ejection fraction, by estimation, is 55 to 60%. The left ventricle has normal function. The left ventricle has no regional wall motion abnormalities. Left ventricular diastolic parameters are  consistent with Grade II diastolic dysfunction (pseudonormalization). Elevated left atrial pressure.  2. Right ventricular systolic function is normal. The right ventricular size is normal. There is normal pulmonary artery systolic pressure. 3. Left atrial size was severely dilated. 4. Right atrial size was moderately dilated.  5. The mitral valve is grossly normal. Moderate to severe mitral valve regurgitation. No evidence of mitral stenosis.  6. Tricuspid valve regurgitation is severe. 7. The aortic valve is tricuspid. Aortic valve regurgitation is mild to  moderate. Mild aortic valve sclerosis is present, with no evidence of aortic valve stenosis.  Micro Data:  Blood 8/1 >> Ascitic fluid 7/31 >>ng Sputum culture 8/1: Rare EIKENELLA CORRODENS typically sens to PCN) Antimicrobials:  ceftx 7/31 >> 8/1 Unasyn 8/1>>> Zosyn 8/9>> Interim history/subjective:    Objective   Blood  pressure 133/76, pulse 79, temperature 98.3 F (36.8 C), temperature  source Axillary, resp. rate 19, height 5\' 2"  (1.575 m), weight 60.8 kg, SpO2 99 %.    Vent Mode: PRVC FiO2 (%):  [40 %-100 %] 40 % Set Rate:  [18 bmp] 18 bmp Vt Set:  [400 mL] 400 mL PEEP:  [5 cmH20] 5 cmH20 Plateau Pressure:  [17 cmH20-19 cmH20] 17 cmH20   Intake/Output Summary (Last 24 hours) at 05/03/2020 0850 Last data filed at 05/03/2020 0700 Gross per 24 hour  Intake 888.36 ml  Output 1105 ml  Net -216.64 ml   Filed Weights   04/30/20 0500 05/01/20 0438 05/02/20 0500  Weight: 66.2 kg 65 kg 60.8 kg    Examination: General: Chronically ill malnourished 59 year old female currently on full ventilatory support once again HEENT normocephalic with the exception of poor dentition and temporal wasting mucous membranes are dry, neck veins flat Pulmonary: Some scattered rhonchi no accessory use Cardiac tachycardic rate no murmur rub or gallop Abdomen soft, not tender, some shifting dullness to percussion bowel sounds present Extremities are warm, weeping however anasarca somewhat improved when comparing to last week exam pulses are palpable scattered areas of ecchymosis noted Neuro: Awake, interactive, moves all extremities profoundly weak GU concentrated yellow urine  Resolved Hospital Problem list    Liver mass -CT showed hypodense liver masses -Abdominal MRI neg  acute kidney injury Anion gap metabolic acidosis  Septic shock (resolved and off pressors as of 8/5) Hyponatremia   Assessment & Plan:   Acute hypoxic respiratory failure secondary to recurrent aspiration pneumonia Portable chest x-ray personally reviewed her endotracheal tubes in satisfactory position, large air collection in the gastric body.  Left lower lobe airspace disease persists 7.8 L positive Sputum pending White blood cell count climbing, low-grade temperature. Plan Continue full ventilator support today PAD protocol RASS goal 0 to  -1 VAP bundle Zosyn day #2, adding vancomycin for HCAP/aspiration as well as we await cultures A.m. chest x-ray  Sepsis, septic shock secondary to recurrent aspiration pneumonia  Remains on vasoactive drips Plan Widening antibiotics Continue to titrate norepinephrine for mean arterial pressure greater than 65 Continue midodrine Recheck lactic acid, may need to consider replacing central venous catheter this would be helpful for volume resuscitation efforts as well  New atrial fibrillation with rapid ventricular response, also ST changes in anterior leads Diastolic heart failure  -Not a candidate for anticoagulation given recent life-threatening bleeding, nor is she a candidate for left heart cath -echo w/out evidence of WM abnormality. Did have grade II diastolic dysfxn but systolic fxn intact  Plan Continue telemetry monitoring  Acute metabolic encephalopathy: Secondary to sepsis and underlying liver disease. Plan PAD protocol, RASS goal 0 As needed lorazepam As needed analgesia  Fluid and electrolyte imbalance: Anasarca, hypernatremia, hyperchloremia, Hypomagnesemia, non- anion gap metabolic acidosis Secondary to low albumin stores and liver disease Plan Continue free water replacement Change maintenance IV fluids to LR A.m. chemistry Replace magnesium   Acute kidney injury: Secondary to hypoperfusion: Creatinine climbing Plan Strict intake output Avoid hypovolemia Renal dose medications A.m. chemistry   UGI bleed with acute blood loss anemia-severe esophagitis on EGD; hemoglobin essentially stable accounting for hemodilution  Plan  continue PPI Continue tube feeds  EtOH cirrhosis with ascites -Transudative, status post paracentesis -[place] eval at bedside 5/6: mod amt of ascites.  Plan Continue to trend intermittent LFTs  Mild hyperglycemia Plan Blood glucose goal 140 to 180  Best practice:  Diet: tubefeeds  Pain/Anxiety/Delirium protocol (if indicated):  Precedex/fent, goal RASS -1 VAP protocol (if indicated): y DVT  prophylaxis: SCDs GI prophylaxis: protonix  Glucose control: SSI Mobility: BR Code Status: full--> I have discussed patient's poor prognosis with the patient herself as well as her husband was at bedside.  I do think we can get her off from the ventilator again, however unfortunately I think her risk is just happening again given her profound deconditioning and failure to thrive is almost a certainty.  I suspect she will need skilled nursing facility, probably a PEG tube, and may be continuing a tracheostomy.  I am not convinced this is in line with however the patient would direct her care.  We will continue to discuss this openly and honestly with the patient and her husband  Family Communication: spouse, [name] Disposition: ICU  My cct 45 minutes  Erick Colace ACNP-BC [place] Pager # [phone] OR # [phone] if no answer

## 2020-05-03 NOTE — Plan of Care (Signed)

## 2020-05-03 NOTE — Progress Notes (Signed)
Nutrition Follow-up  DOCUMENTATION CODES:   Severe malnutrition in context of chronic illness  INTERVENTION:  - will adjust TF regimen: Vital AF 1.2 @ 20 ml/hr to advance by 10 ml every 24 hours to reach goal rate of 50 ml/hr with 45 ml Prosource TF once/day. - at goal rate, this regimen will provide 1480 kcal (98% estimated kcal need), 101 grams protein, and 973 ml free water.  - talked with CCM about adding prokinetic.  - free water flush, if desired, to be per CCM.   Monitor magnesium, potassium, and phosphorus daily for at least 3 days, MD to replete as needed, as pt is at risk for refeeding syndrome given severe malnutrition, current mild hypomagnesemia.   NUTRITION DIAGNOSIS:   Severe Malnutrition related to chronic illness (alcoholic cirrhosis) as evidenced by moderate fat depletion, moderate muscle depletion, severe fat depletion, severe muscle depletion. -ongoing  GOAL:   Patient will meet greater than or equal to 90% of their needs -to be met with TF regimen  MONITOR:   Vent status, TF tolerance, Labs, Weight trends  REASON FOR ASSESSMENT:   Ventilator Enteral/tube feeding initiation and management  ASSESSMENT:   59-year-old female with medical history of alcoholic cirrhosis, ascites, DM, CHF, CKD, COPD, asthma, MI, and stroke. She was admitted with coffee-ground emesis, drop in Hgb, hyponatremia, and AKI. She underwent EGD which showed severe esophagitis and required intubation in PACU post-procedure. She developed progressive shock after intubation.  Significant Events: 7/31- admission; paracentesis (4L removed); intubation; upper endoscopy  8/2- L nare NGT placement; initial RD assessment 8/6- extubation 8/7- resume TF with slow advancement 8/9- re-intubation at ~1745   Weight is starting to trend back down and weight from yesterday (60.8 kg) used to re-estimate needs for patient who is now back on the vent. TF was resumed on 8/7. Able to talk with RN at  bedside who reports patient with large volume of vomiting at time of intubation and that she had another episode of vomiting overnight so TF at 40 ml/hr because of these events.  L nare NGT remains in place and she is currently receiving Osmolite 1.2 @ 40 ml/hr with 45 ml Prosource TF BID. This regimen is providing 1232 kcal, 75 grams protein, and 787 ml free water.  Flow sheet documentation indicates moderate pitting edema to BLE and perineal area; mild edema to BUE.   Palliative Care is following and last saw patient nad talked with husband earlier this AM. Patient remains Full Code.    Patient is currently intubated on ventilator support MV: 10.8 L/min Temp (24hrs), Avg:98.8 F (37.1 C), Min:97 F (36.1 C), Max:99.9 F (37.7 C) Propofol: none BP: 142/77 and MAP: 96  Labs reviewed; CBG: 153 mg/dl, Na: 147 mmol/l, Cl: 119 mmol/l, BUN: 57 mg/dl, creatinine: 1.25 mg/dl, Mg: 1.5 mg/dl, GFR: 47 ml/min. Medications reviewed; 100 mg colace BID, 1 mg folvite/day, 50 mg solu-cortef BID, sliding scale novolog, 1 tablet multivitamin with minerals, 40 mg IV protonix BID, 17 g miralax/day, 100 mg IV thiamine/day.  Drips; levo @ 9 mcg/min, precedex @ 0.5 mcg/kg/hr.   Diet Order:   Diet Order            Diet NPO time specified  Diet effective now                 EDUCATION NEEDS:   No education needs have been identified at this time  Skin:  Skin Assessment: Reviewed RN Assessment (MASD buttocks and groin)  Last BM:    8/10 (450 ml via rectal tube during night shift)  Height:   Ht Readings from Last 1 Encounters:  05/02/20 5' 2" (1.575 m)    Weight:   Wt Readings from Last 1 Encounters:  05/02/20 60.8 kg     Estimated Nutritional Needs:  Kcal:  1509 kcal Protein:  91-109 grams Fluid:  >/= 1.5 L/day      , MS, RD, LDN, CNSC Inpatient Clinical Dietitian RD pager # available in AMION  After hours/weekend pager # available in AMION  

## 2020-05-03 NOTE — TOC Progression Note (Signed)
Transition of Care [facility]) - Progression Note    Patient Details  Name: Margarete Horace MRN: [id number] Date of Birth: [birth year]  [facility] CM/SW Contact  Leeroy Cha, RN Phone Number: 05/03/2020, 9:22 AM  Clinical Narrative:    Pt re intubated on [id number].  Husband still wants full care and code in hope that patient will improve.  Has been told by palliative care that this may not be able to occur.     Expected Discharge Plan: Home/Self Care Barriers to Discharge: Continued Medical Work up  Expected Discharge Plan and Services Expected Discharge Plan: Home/Self Care   Discharge Planning Services: CM Consult   Living arrangements for the past 2 months: Apartment                                       Social Determinants of Health (SDOH) Interventions    Readmission Risk Interventions No flowsheet data found.

## 2020-05-03 NOTE — Progress Notes (Signed)
PT Cancellation Note  Patient Details Name: Melissa Kent MRN: [id number] DOB: [birth year]   Cancelled Treatment:     Pt re intubated.Marland Kitchen...will continue to monitor.  Pt was evaluated on 8/7 with rec for SNF.     Nathanial Rancher 05/03/2020, 9:06 AM

## 2020-05-04 ENCOUNTER — Inpatient Hospital Stay: Payer: Self-pay

## 2020-05-04 ENCOUNTER — Inpatient Hospital Stay (HOSPITAL_COMMUNITY): Payer: BC Managed Care – PPO

## 2020-05-04 DIAGNOSIS — Z87898 Personal history of other specified conditions: Secondary | ICD-10-CM | POA: Diagnosis not present

## 2020-05-04 DIAGNOSIS — N179 Acute kidney failure, unspecified: Secondary | ICD-10-CM | POA: Diagnosis not present

## 2020-05-04 DIAGNOSIS — J9601 Acute respiratory failure with hypoxia: Secondary | ICD-10-CM | POA: Diagnosis not present

## 2020-05-04 DIAGNOSIS — J690 Pneumonitis due to inhalation of food and vomit: Secondary | ICD-10-CM | POA: Diagnosis not present

## 2020-05-04 LAB — COMPREHENSIVE METABOLIC PANEL
ALT: 34 U/L (ref 0–44)
AST: 36 U/L (ref 15–41)
Albumin: 2.2 g/dL — ABNORMAL LOW (ref 3.5–5.0)
Alkaline Phosphatase: 234 U/L — ABNORMAL HIGH (ref 38–126)
Anion gap: 10 (ref 5–15)
BUN: 56 mg/dL — ABNORMAL HIGH (ref 6–20)
CO2: 19 mmol/L — ABNORMAL LOW (ref 22–32)
Calcium: 9.6 mg/dL (ref 8.9–10.3)
Chloride: 116 mmol/L — ABNORMAL HIGH (ref 98–111)
Creatinine, Ser: 1.13 mg/dL — ABNORMAL HIGH (ref 0.44–1.00)
GFR calc Af Amer: >60 mL/min (ref >60)
GFR calc non Af Amer: 53 mL/min — ABNORMAL LOW (ref >60)
Glucose, Bld: 153 mg/dL — ABNORMAL HIGH (ref 70–99)
Potassium: 4.5 mmol/L (ref 3.5–5.1)
Sodium: 145 mmol/L (ref 135–145)
Total Bilirubin: 1.6 mg/dL — ABNORMAL HIGH (ref 0.3–1.2)
Total Protein: 5.3 g/dL — ABNORMAL LOW (ref 6.5–8.1)

## 2020-05-04 LAB — CULTURE, RESPIRATORY W GRAM STAIN

## 2020-05-04 LAB — GLUCOSE, CAPILLARY
Glucose-Capillary: 112 mg/dL — ABNORMAL HIGH (ref 70–99)
Glucose-Capillary: 117 mg/dL — ABNORMAL HIGH (ref 70–99)
Glucose-Capillary: 119 mg/dL — ABNORMAL HIGH (ref 70–99)
Glucose-Capillary: 128 mg/dL — ABNORMAL HIGH (ref 70–99)
Glucose-Capillary: 141 mg/dL — ABNORMAL HIGH (ref 70–99)
Glucose-Capillary: 142 mg/dL — ABNORMAL HIGH (ref 70–99)

## 2020-05-04 LAB — PHOSPHORUS: Phosphorus: 3.7 mg/dL (ref 2.5–4.6)

## 2020-05-04 LAB — CBC
HCT: 33.7 % — ABNORMAL LOW (ref 36.0–46.0)
Hemoglobin: 10.4 g/dL — ABNORMAL LOW (ref 12.0–15.0)
MCH: 30 pg (ref 26.0–34.0)
MCHC: 30.9 g/dL (ref 30.0–36.0)
MCV: 97.1 fL (ref 80.0–100.0)
Platelets: 123 10*3/uL — ABNORMAL LOW (ref 150–400)
RBC: 3.47 MIL/uL — ABNORMAL LOW (ref 3.87–5.11)
RDW: 20.5 % — ABNORMAL HIGH (ref 11.5–15.5)
WBC: 38.8 10*3/uL — ABNORMAL HIGH (ref 4.0–10.5)
nRBC: 0 % (ref 0.0–0.2)

## 2020-05-04 LAB — MAGNESIUM: Magnesium: 2.3 mg/dL (ref 1.7–2.4)

## 2020-05-04 MED ORDER — EPINEPHRINE 1 MG/10ML IJ SOSY
PREFILLED_SYRINGE | INTRAMUSCULAR | Status: AC
  Filled 2020-05-04: qty 10

## 2020-05-04 MED ORDER — PHENTOLAMINE MESYLATE 5 MG IJ SOLR
10.0000 mg | Freq: Once | INTRAMUSCULAR | Status: AC
  Administered 2020-05-04: 10 mg via SUBCUTANEOUS
  Filled 2020-05-04: qty 10

## 2020-05-04 MED ORDER — SODIUM CHLORIDE 0.9% FLUSH
10.0000 mL | Freq: Two times a day (BID) | INTRAVENOUS | Status: DC
  Administered 2020-05-04 – 2020-05-05 (×2): 10 mL

## 2020-05-04 MED ORDER — SODIUM CHLORIDE 0.9 % IV SOLN
2.0000 g | INTRAVENOUS | Status: DC
  Administered 2020-05-04 – 2020-05-05 (×2): 2 g via INTRAVENOUS
  Filled 2020-05-04 (×2): qty 2

## 2020-05-04 MED ORDER — SODIUM CHLORIDE 0.9% FLUSH: 10.0000 mL | INTRAVENOUS | Status: DC | PRN

## 2020-05-04 MED ORDER — ORAL CARE MOUTH RINSE
15.0000 mL | Freq: Two times a day (BID) | OROMUCOSAL | Status: DC
  Administered 2020-05-04 – 2020-05-05 (×2): 15 mL via OROMUCOSAL

## 2020-05-04 NOTE — Progress Notes (Signed)
Peripherally Inserted Central Catheter Placement  The IV Nurse has discussed with the patient and/or persons authorized to consent for the patient, the purpose of this procedure and the potential benefits and risks involved with this procedure.  The benefits include less needle sticks, lab draws from the catheter, and the patient may be discharged home with the catheter. Risks include, but not limited to, infection, bleeding, blood clot (thrombus formation), and puncture of an artery; nerve damage and irregular heartbeat and possibility to perform a PICC exchange if needed/ordered by physician.  Alternatives to this procedure were also discussed.  Bard Power PICC patient education guide, fact sheet on infection prevention and patient information card has been provided to patient /or left at bedside.    PICC Placement Documentation  PICC Triple Lumen 05/04/20 PICC Left Brachial 40 cm 2 cm (Active)  Indication for Insertion or Continuance of Line Vasoactive infusions 05/04/20 2023  Exposed Catheter (cm) 2 cm 05/04/20 2023  Site Assessment Clean;Dry;Intact 05/04/20 2023  Lumen #1 Status Blood return noted;Flushed;Saline locked 05/04/20 2023  Lumen #2 Status Blood return noted;Flushed;Saline locked 05/04/20 2023  Lumen #3 Status Blood return noted;Flushed;Saline locked 05/04/20 2023  Dressing Type Transparent;Occlusive;Securing device 05/04/20 2023  Dressing Status Clean;Dry;Intact;Antimicrobial disc in place 05/04/20 2023  Safety Lock Not Applicable 68/86/48 4720  Line Adjustment (NICU/IV Team Only) No 05/04/20 2023  Dressing Intervention New dressing 05/04/20 2023  Dressing Change Due 05/11/20 05/04/20 2023       Edson Snowball 05/04/2020, 8:51 PM

## 2020-05-04 NOTE — Progress Notes (Signed)
Occupational Therapy Treatment Patient Details Name: Melissa Kent MRN: [id number] DOB: 27-May-1961 Today's Date: 05/04/2020    History of present illness Pt is 59 yo female with EtOH cirrhosis, ascities, HTN, neutrophilia. Pt presented with coffee-ground emesis for 3 days.  She was admitted with upper GI bleed, EGD showed severe esophagitis.  Pt required intubation s/p EGD on 7/31 due to shock and was extubated 03/29/20.  She had paracentesis on 7/31 removing 4 L.  While intubated pt with worsening shock requiring pressors. Patient reintubated 8/6 and extubated 8/11.   OT comments  Treatment focused on therapeutic exercises in arms and legs to improve ROM and strength in extremities and reduce edema in order to use extremities for functional mobility and ADLs. Patient required active assist to perform exercises to guide movement but able to activate muscle groups with tactile and verbal cues. Patient able to predominantly attend to task. Cont POC    Follow Up Recommendations  SNF    Equipment Recommendations  None recommended by OT    Recommendations for Other Services      Precautions / Restrictions Precautions Precaution Comments: urostomy, dubhoff tube, NG tube, rectal tube Restrictions Weight Bearing Restrictions: No       Mobility Bed Mobility                  Transfers                      Balance                                           ADL either performed or assessed with clinical judgement   ADL                                               Vision   Additional Comments: Patient able to grossly visually attend to therapeutic exercises   Perception     Praxis      Cognition Arousal/Alertness: Awake/alert Behavior During Therapy: Flat affect Overall Cognitive Status: Difficult to assess                                          Exercises Other Exercises Other Exercises: AAROM finger  flexion, wrist extension, elbow flexion, shoulder flexion BUEs while supine in bed. 10 reps each arm, each muscle group.Patient able to initiate movement; therapist guiding movement to improve ROM/quality of movement. Other Exercises: Hip Adduction x 10, Knee Extension x 10, Dirsiflexion x 10 with Active Assist from therapist.   Shoulder Instructions       General Comments      Pertinent Vitals/ Pain       Pain Assessment: Faces Faces Pain Scale: Hurts a little bit Pain Location: Bil feet  Home Living                                          Prior Functioning/Environment              Frequency  Min 2X/week  Progress Toward Goals  OT Goals(current goals can now be found in the care plan section)  Progress towards OT goals: Not progressing toward goals - comment (Patient required reintubation. Needs more to meet goals.)  Acute Rehab OT Goals Patient Stated Goal: unable to state OT Goal Formulation: Patient unable to participate in goal setting Time For Goal Achievement: 05/15/20 Potential to Achieve Goals: [place] Discharge plan remains appropriate    Co-evaluation                 AM-PAC OT "6 Clicks" Daily Activity     Outcome Measure   Help from another person eating meals?: None Help from another person taking care of personal grooming?: None Help from another person toileting, which includes using toliet, bedpan, or urinal?: None Help from another person bathing (including washing, rinsing, drying)?: None Help from another person to put on and taking off regular upper body clothing?: None   6 Click Score: 20    End of Session    OT Visit Diagnosis: Muscle weakness (generalized) (M62.81)   Activity Tolerance Patient limited by fatigue   Patient Left in bed;with call bell/phone within reach   Nurse Communication  (okay to see per RN)        Time: 8916-9450 OT Time Calculation (min): 21 min  Charges: OT General  Charges $OT Visit: 1 Visit OT Treatments $Therapeutic Exercise: 8-22 mins  Derl Barrow, OTR/L [place]  Office [phone] Pager: [place] 05/04/2020, 5:05 PM

## 2020-05-04 NOTE — Progress Notes (Signed)
eLink Physician-Brief Progress Note Patient Name: Melissa Kent DOB: 07/04/1961 MRN: [id number]   Date of Service  05/04/2020  HPI/Events of Note  Peripheral IV with Norepinephrine IV infusion has infiltrated.   eICU Interventions  Plan: 1. Phentolamine 10 mg [place] around infiltration site.  2. Pharmacy at [facility] is kind enough to help nursing with administration of Phentolamine.      Intervention Category Major Interventions: Other:  Lysle Dingwall 05/04/2020, 1:38 AM

## 2020-05-04 NOTE — Progress Notes (Addendum)
Palliative-   Noted discussion with PCCM NP- Laurey Arrow today- appreciate his input into goals of care.   Tywana is extubated, remains extremely weak. Tracks, nods head unreliably to orientation questions.  Met patient's sons today at bedside. Discussed with them further concerns that there are factors that may impair Amairani's ability to fully comprehend her situation and that their role is not one of decision maker- but rather one to speak for her and tell us what she would tell us if she could speak for herself.   In follow-up conversations- Palliative will continue to follow and discuss not only code status and reintubation but true overall reasonable goals of care and how to support them. Decisions will need to be made regarding feeding tubes, nursing facility placement and what patient's feelings would be about ongoing aggressive medical care vs options of comfort measures given her very low likelihood of returning to a state of independent living, as well as considering how to limit her suffering. Will include her sons in these discussions as well. On previous assessments Brei did not appear to be decisional with unreliable head shakes and responses to questions- however, will continue to assess her ability to understand.   Mariana Kaufman, AGNP-C Palliative Medicine  Please call Palliative Medicine team phone with any questions [phone]. For individual providers please see AMION.  Total time: 35 minutes

## 2020-05-04 NOTE — Progress Notes (Signed)
0100 - Pts PIV site found to be considerably more edematous and cool-to-touch upon hourly assessment. Peripheral pressors infusing moved to separate IV site. Pt reports mild pain at original site. Elink is notified of infiltration.  0110 - While attempting to place a brand new IV site on left arm, the PIV where the pressors are now running begins to appear edematous and ecchymotic as well. All infusions stopped at this time. Tube feeds are paused and patient is placed in a flat lying position.   0130 - A new PIV site is established in the L upper arm and the pressors are resumed at the new site. IV team is consulted and called for a stat IV placement. Orders for antidote medications are placed by MD. At this time, it is noted by the primary RN that the L upper arm IV site appears to be developing a hematoma. All IV infusions are discontinued again.  0145 - IV team at bedside attempting to place new IV. IV team is made aware by primary RN where the previous sites of infiltration are. Two new PIVs are placed in the bilateral wrists by the IV team.  0153 - Phentolamine is administered SQ in the R lateral forearm. The site has become visibly more red and painful to the touch. All three sites where PIVs were removed after levo was infused through them are outlined with a surgical pen to note the growth of erythema or blanching  0200 - Levo is resumed in the L lateral wrist PIV while the R lateral wrist IV remains saline locked.   The PIV site where pressors are running continues to be assessed Q1hr for the remainder of the shift with no signs of infiltration noted. The three marked sites are also reassessed frequently with little growth outside of the previously marked lines but they continue to be painful to the touch for the patient. Recommendations are made to the care team for PICC placement in the morning.

## 2020-05-04 NOTE — Progress Notes (Signed)
Long discussion with the patient's husband and two sons (in the room with the patient). We discussed the following:  -her baseline poor health -her malnutrition, deconditioning, poor health, all in the setting of her second critical illness as a consequence of recurrent aspiration. The concern that I shared was primarily her risk of recurrent aspiration happening again in the near future AND if that were to happen her exceeding low chance she could recover. I have asked them to meet as a family and discuss the "what if" scenario...specifically I have asked the patient and her family to decide if this were to happen again would she want to go back on the vent knowing that it would only make her weaker and the risk of this happening again would still be present. When I asked her she clearly shook her head "no" but then her husband quickly intervened saying "we all want you home and I know you can do this". I have asked the family to decide collectively on what we do if this happens again. Vent or Not. If they choose yes we will continue care understanding the high likelihood we will be having same discussion in near future. If they choose No I still assured them we would continue aggressive care including: IV hydration, antibiotics, oxygen, physical therapy and nutritional support but should she fail these measures and or have acute respiratory distress/failure we would not go back on machine. I asked Mrs Polasek if she understood that not going on the machine ment she could die of pneumonia if this were to happen again. She indicated with a nod "yes" she understood. I do think her mental status is not 100% at baseline so I have asked that the family collectively come to agreement on this.   We will meet again in the morning.   Erick Colace ACNP-BC [place] Pager # [phone] OR # [phone] if no answer

## 2020-05-04 NOTE — Progress Notes (Addendum)
NAME:  Melissa Kent, MRN:  [id number], DOB:  1961/07/11, LOS: 58 ADMISSION DATE:  04/02/2020, CONSULTATION DATE:  05/04/2020  REFERRING MD:  Benny Lennert, [facility] CHIEF COMPLAINT: Respiratory distress post EGD  Brief History   59 year old with EtOH cirrhosis and ascites admitted with coffee-ground emesis, drop in hemoglobin, hyponatremia and AKI, underwent EGD which showed severe esophagitis , required intubation in PACU postprocedure. Developed progressive shock post intubation/sedation  We had signed off the patient about 2 days ago Was asked to see you today secondary to respiratory distress Possible aspiration  Past Medical History  EtOH cirrhosis, ascites Leukocytosis  Significant Hospital Events   7/31 EGD showed esophagitis. Also had paracentesis removed 4 liters. Intubated in PACU d/t shock state resp  8/1: Breakthrough agitation on low-dose Precedex and intermittent fentanyl, CVP remains low 8/2: worsening shock over last 24 hrs. On levophed, vasopressin and phenylephrine. CVP-->still low. hgb 7.4 ->2 units total on 7/31 and 8/1, peaked at 9.2-->down again to 7.8-->transfusing 3rd unit for active shock  8/3: Vasoactive drip requirements improved, awake, following commands, still fairly tachypneic but chest x-ray improved off phenylephrine, still on norepinephrine as well as vasopressin, developed new atrial fibrillation with rapid ventricular response and was placed on amiodarone infusion 8/4: Clinically looks about the same.  Still on vasoactive drips.  Not able to wean much.  Developed some ST changes in anterior leads, denied chest pain, had no hemodynamic changes, no worsening work of breathing 8/5 off vasopressin. Levophed req better. ECHO w/ nml EF but gd II diastolic dysfxn. Giving lasix. Stopping fent gtt.  8/6: Off all pressors.  Amiodarone discontinued.  Off sedating drips.  Past spontaneous breathing trial, chest x-ray better following diuresis.  Successfully extubated. 8/7:  Complete continuing stress dose steroids, diuresing.  Central line removed, speech therapy consulted, transition to internal medicine Palliative care team consulted for goals of care. 8/8: Very weak, still barely able to talk louder than whisper.  Not able to take p.o.'s 8/9: Aspiration event resulting in progressive and recurrent respiratory failure and ultimately intubation 8/10 looks better. Passed SBT; extubated. Sputum w/ ecoli. Vanc and zosyn stopped. Placed on rocephin  Consults:  GI  Procedures:  EGD 7/31 severe esophagitis, no varices 7/31 4 L paracentesis ETT 7/31 >>8/6 LIJ 7/31 >>out    Significant Diagnostic Tests:  CT renal stone study 7/30 >> small bilateral effusions, cirrhosis, small hiatal hernia, large ill-defined hypodense liver masses, large ascites RUQ ultrasound 7/31 >> large ascites, liver mass not well visualized MRI abdomen 7/31 >> no clear mass identified Echocardiogram 8/4>>>Left ventricular ejection fraction, by estimation, is 55 to 60%. The left ventricle has normal function. The left ventricle has no regional wall motion abnormalities. Left ventricular diastolic parameters are  consistent with Grade II diastolic dysfunction (pseudonormalization). Elevated left atrial pressure.  2. Right ventricular systolic function is normal. The right ventricular size is normal. There is normal pulmonary artery systolic pressure. 3. Left atrial size was severely dilated. 4. Right atrial size was moderately dilated.  5. The mitral valve is grossly normal. Moderate to severe mitral valve regurgitation. No evidence of mitral stenosis.  6. Tricuspid valve regurgitation is severe. 7. The aortic valve is tricuspid. Aortic valve regurgitation is mild to  moderate. Mild aortic valve sclerosis is present, with no evidence of aortic valve stenosis.  Micro Data:  Blood 8/1 >> Ascitic fluid 7/31 >>ng Sputum culture 8/1: Rare EIKENELLA CORRODENS typically sens to PCN) Sputum 8/9:  e coli>>> Antimicrobials:  ceftx 7/31 >> 8/1 Unasyn  8/1>>>off Zosyn 8/9>>8/11 Rocephin 8/11>>> Interim history/subjective:  Looks good  Objective   Blood pressure 98/62, pulse (Abnormal) 49, temperature 97.7 F (36.5 C), temperature source Oral, resp. rate 17, height 5\' 2"  (1.575 m), weight 60.8 kg, SpO2 97 %.    Vent Mode: PSV;CPAP FiO2 (%):  [30 %-40 %] 30 % Set Rate:  [18 bmp] 18 bmp Vt Set:  [400 mL] 400 mL PEEP:  [5 cmH20] 5 cmH20 Pressure Support:  [10 cmH20] 10 cmH20 Plateau Pressure:  [14 cmH20-19 cmH20] 15 cmH20   Intake/Output Summary (Last 24 hours) at 05/04/2020 0827 Last data filed at 05/04/2020 0754 Gross per 24 hour  Intake 3052.72 ml  Output 450 ml  Net 2602.72 ml   Filed Weights   04/30/20 0500 05/01/20 0438 05/02/20 0500  Weight: 66.2 kg 65 kg 60.8 kg    Examination: General this is a chronically ill malnourished 59 year old white female currently cycling on pressure support ventilation/spontaneous breathing trial with acceptable minute ventilation HEENT: Orally intubated, temporal wasting, poor dentition, neck veins flat, left old central line dressing clean dry and intact Pulmonary: Scattered rhonchi equal chest rise no accessory use no nasal flare currently on pressure support of 5/PEEP 5, tidal volumes in the 450-500 range Cardiac regular rate and rhythm without murmur rub or gallop Abdomen is soft, not tender, no organomegaly.  Tolerating tube feeds Extremities warm, weeping anasarca however edema has improved compared to previous exams Neuro awake, interactive, profoundly weak.  She is not able to really lift her arms against gravity GU clear yellow  Resolved Hospital Problem list    Liver mass -CT showed hypodense liver masses -Abdominal MRI neg  acute kidney injury Anion gap metabolic acidosis  Septic shock (resolved and off pressors as of 8/5) Hyponatremia   Assessment & Plan:   Acute hypoxic respiratory failure secondary to recurrent  aspiration pneumonia -Portable chest x-ray personally reviewed demonstrating endotracheal tube in satisfactory position.  Ongoing right sided airspace disease.  Left side improved. -No significant fever spike,White blood cell count trending down Sputum cultures growing rare E. Coli -Passing spontaneous breathing trial Plan Extubate  DC sedation  Pulmonary hygiene  N.p.o. for now, continue tube feeds  Change Zosyn to ceftriaxone, complete 7 days total therapy  Discontinue vancomycin  Assess daily for diuresis we will hold today as she is still on pressors    Sepsis, septic shock secondary to recurrent aspiration pneumonia  Remains on vasoactive drips, some of this may be sedation related -Volume status acceptable Plan Antibiotics as mentioned above  We will request PICC line placement she is going to need ongoing IV hydration and lab work  Titrate norepinephrine for systolic blood pressure greater than 90  Continue midodrine   New atrial fibrillation with rapid ventricular response, also ST changes in anterior leads Diastolic heart failure  -Not a candidate for anticoagulation given recent life-threatening bleeding, nor is she a candidate for left heart cath -echo w/out evidence of WM abnormality. Did have grade II diastolic dysfxn but systolic fxn intact  Plan Rate control Telemetry monitoring  Acute metabolic encephalopathy: Secondary to sepsis and underlying liver disease. Plan Discontinue Precedex  As needed low-dose lorazepam  Supportive care  Mobilize   Fluid and electrolyte imbalance: Anasarca, hypernatremia, hyperchloremia,  non- anion gap metabolic acidosis -Sodium improved.  Persistent non-anion gap metabolic acidosis, I suspect some of this is from bicarbonate loss with diarrhea, also ongoing hyperchloremia Plan Continue free water replacement via tube Holding diuresis today KVO IV fluids A.m. chemistry,  if nongap acidosis continues might consider via tube  bicarbonate replacement  Acute kidney injury: Secondary to hypoperfusion: Creatinine improved with volume replacement Plan Strict intake output KVO fluid Holding diuresis   UGI bleed with acute blood loss anemia-severe esophagitis on EGD; hemoglobin essentially stable accounting for hemodilution  Plan  PPI Tube feeds  EtOH cirrhosis with ascites -Transudative, status post paracentesis -[place] eval at bedside 5/6: mod amt of ascites.  Plan Intermittent LFTs  Mild hyperglycemia Plan Blood glucose goal 140-180  Severe protein calorie malnutrition with failure to thrive in setting of what appears to be essentially end-stage liver disease Plan Continue tube feeds given what appears to be significant dysphagia Physical therapy Ongoing discussion with the patient and her husband in regards to goals of care.  Although once again we will get her extubated again today her risk of reintubation is exceedingly high   Best practice:  Diet: tubefeeds  Pain/Anxiety/Delirium protocol (if indicated): Precedex/fent, goal RASS -1 VAP protocol (if indicated): y DVT prophylaxis: SCDs GI prophylaxis: protonix  Glucose control: SSI Mobility: BR Code Status: full--> full code   family Communication: spouse, [name] Disposition: ICU  Critical care time is 40 minutes  Erick Colace ACNP-BC [place] Pager # [phone] OR # [phone] if no answer

## 2020-05-04 NOTE — Hospital Course (Signed)
7/31 EGD showed esophagitis. Also had paracentesis removed 4 liters. Intubated in PACU d/t shock state resp  8/1: Breakthrough agitation on low-dose Precedex and intermittent fentanyl, CVP remains low 8/2: worsening shock over last 24 hrs. On levophed, vasopressin and phenylephrine. CVP-->still low. hgb 7.4 ->2 units total on 7/31 and 8/1, peaked at 9.2-->down again to 7.8-->transfusing 3rd unit for active shock  8/3: Vasoactive drip requirements improved, awake, following commands, still fairly tachypneic but chest x-ray improved off phenylephrine, still on norepinephrine as well as vasopressin, developed new atrial fibrillation with rapid ventricular response and was placed on amiodarone infusion 8/4: Clinically looks about the same.  Still on vasoactive drips.  Not able to wean much.  Developed some ST changes in anterior leads, denied chest pain, had no hemodynamic changes, no worsening work of breathing 8/5 off vasopressin. Levophed req better. ECHO w/ nml EF but gd II diastolic dysfxn. Giving lasix. Stopping fent gtt.  8/6: Off all pressors.  Amiodarone discontinued.  Off sedating drips.  Past spontaneous breathing trial, chest x-ray better following diuresis.  Successfully extubated. 8/7: Complete continuing stress dose steroids, diuresing.  Central line removed, speech therapy consulted, transition to internal medicine Palliative care team consulted for goals of care. 8/8: Very weak, still barely able to talk louder than whisper.  Not able to take p.o.'s 8/9: Aspiration event resulting in progressive and recurrent respiratory failure and ultimately intubation 8/10 looked better. Passed SBT; extubated. Sputum w/ ecoli. Vanc and zosyn stopped. Placed on rocephin. On-going goals of care. Husband seemingly having a hard time grasping how weak and debilitated she is.

## 2020-05-04 NOTE — Progress Notes (Signed)
Consult for I.V. start due to multiple areas of levophed infiltration. Due to poor skin integrity related to existing skin tears access is limited. PICC line recommended for more suitable access option due medication and drips infusing.

## 2020-05-04 NOTE — Procedures (Signed)
Extubation Procedure Note  Patient Details:   Name: Melissa Kent DOB: [birth year] MRN: [id number]   Airway Documentation:  Airway 7.5 mm (Active)  Secured at (cm) 23 cm 05/04/20 0803  Measured From Lips 05/04/20 [place] 05/04/20 0803  Secured By [employer] 05/04/20 0803  Tube Holder Repositioned Yes 05/04/20 0803  Cuff Pressure (cm H2O) 30 cm H2O 05/04/20 0803  Site Condition Dry 05/04/20 0803   Vent end date: 04/29/20 Vent end time: 0905   Evaluation  O2 sats: stable throughout Complications: No apparent complications Patient did tolerate procedure well. Bilateral Breath Sounds: Diminished, Rhonchi   Yes  Johnette Abraham 05/04/2020, 8:44 AM

## 2020-05-04 NOTE — Progress Notes (Signed)
Pharmacy Antibiotic Note  Melissa Kent is a 59 y.o. female with a h/o ETOH cirrhosis admitted on 03/26/2020 with coffee-ground emesis, hyponatremia, and AKI.  Pharmacy has been  consulted for ceftriaxone dosing for PNA, sputum growing E.coli.   Plan: DC Vanc/Zosyn Ceftriaxone 2g IV q24h through 8/15 to complete 7 days total  Height: 5\' 2"  (157.5 cm) Weight: 60.8 kg (134 lb 0.6 oz) IBW/kg (Calculated) : 50.1  Temp (24hrs), Avg:97.5 F (36.4 C), Min:97.2 F (36.2 C), Max:97.7 F (36.5 C)  Recent Labs  Lab 04/30/20 0618 05/01/20 0122 05/02/20 1125 05/03/20 0304 05/03/20 1150 05/03/20 1431 05/04/20 0214  WBC 26.5* 30.9* 32.3* 49.4*  --   --  38.8*  CREATININE 0.86 0.96 1.20* 1.25*  --   --  1.13*  LATICACIDVEN  --   --   --   --  1.4 1.6  --     Estimated Creatinine Clearance: 45.5 mL/min (A) (by C-G formula based on SCr of 1.13 mg/dL (H)).    No Known Allergies   Antimicrobials this admission: 7/31 CTX x 1 8/1 Unasyn>>8/7  8/1 vancomycin >> 8/2, resume 8/10 >> 8/11 8/9 zosyn >> 8/11 8/11 CTX >> (8/15)  Microbiology results: 8/1BCx: ngF 7/31 peritoneal fluid: ngf 8/1 TA: pending - rare GPC pairs, rare GNR -Eiknella corrodens - per culture note, usually susceptible to PCN 7/31MRSA PCR: neg 8/9 GI panel: neg 8/9 C.diff panel:neg 8/9 Trach asp: rare E.coli  Thank you for allowing pharmacy to be a part of this patient's care.  Peggyann Juba, PharmD, BCPS Pharmacy: [phone] 05/04/2020 9:21 AM

## 2020-05-05 ENCOUNTER — Inpatient Hospital Stay (HOSPITAL_COMMUNITY): Payer: BC Managed Care – PPO

## 2020-05-05 DIAGNOSIS — K922 Gastrointestinal hemorrhage, unspecified: Secondary | ICD-10-CM | POA: Diagnosis not present

## 2020-05-05 DIAGNOSIS — Z515 Encounter for palliative care: Secondary | ICD-10-CM

## 2020-05-05 DIAGNOSIS — T17908D Unspecified foreign body in respiratory tract, part unspecified causing other injury, subsequent encounter: Secondary | ICD-10-CM

## 2020-05-05 LAB — COMPREHENSIVE METABOLIC PANEL
ALT: 39 U/L (ref 0–44)
AST: 38 U/L (ref 15–41)
Albumin: 2.4 g/dL — ABNORMAL LOW (ref 3.5–5.0)
Alkaline Phosphatase: 281 U/L — ABNORMAL HIGH (ref 38–126)
Anion gap: 10 (ref 5–15)
BUN: 64 mg/dL — ABNORMAL HIGH (ref 6–20)
CO2: 18 mmol/L — ABNORMAL LOW (ref 22–32)
Calcium: 9.5 mg/dL (ref 8.9–10.3)
Chloride: 115 mmol/L — ABNORMAL HIGH (ref 98–111)
Creatinine, Ser: 1.22 mg/dL — ABNORMAL HIGH (ref 0.44–1.00)
GFR calc Af Amer: 56 mL/min — ABNORMAL LOW (ref >60)
GFR calc non Af Amer: 48 mL/min — ABNORMAL LOW (ref >60)
Glucose, Bld: 163 mg/dL — ABNORMAL HIGH (ref 70–99)
Potassium: 3.8 mmol/L (ref 3.5–5.1)
Sodium: 143 mmol/L (ref 135–145)
Total Bilirubin: 1.4 mg/dL — ABNORMAL HIGH (ref 0.3–1.2)
Total Protein: 5.6 g/dL — ABNORMAL LOW (ref 6.5–8.1)

## 2020-05-05 LAB — CBC
HCT: 33.6 % — ABNORMAL LOW (ref 36.0–46.0)
Hemoglobin: 10.8 g/dL — ABNORMAL LOW (ref 12.0–15.0)
MCH: 30.3 pg (ref 26.0–34.0)
MCHC: 32.1 g/dL (ref 30.0–36.0)
MCV: 94.4 fL (ref 80.0–100.0)
Platelets: 233 K/uL (ref 150–400)
RBC: 3.56 MIL/uL — ABNORMAL LOW (ref 3.87–5.11)
RDW: 21.2 % — ABNORMAL HIGH (ref 11.5–15.5)
WBC: 42.9 K/uL — ABNORMAL HIGH (ref 4.0–10.5)
nRBC: 0 % (ref 0.0–0.2)

## 2020-05-05 LAB — MAGNESIUM: Magnesium: 2.1 mg/dL (ref 1.7–2.4)

## 2020-05-05 LAB — BASIC METABOLIC PANEL
Anion gap: 11 (ref 5–15)
BUN: 63 mg/dL — ABNORMAL HIGH (ref 6–20)
CO2: 16 mmol/L — ABNORMAL LOW (ref 22–32)
Calcium: 9.3 mg/dL (ref 8.9–10.3)
Chloride: 114 mmol/L — ABNORMAL HIGH (ref 98–111)
Creatinine, Ser: 1.19 mg/dL — ABNORMAL HIGH (ref 0.44–1.00)
GFR calc Af Amer: 57 mL/min — ABNORMAL LOW (ref >60)
GFR calc non Af Amer: 50 mL/min — ABNORMAL LOW (ref >60)
Glucose, Bld: 146 mg/dL — ABNORMAL HIGH (ref 70–99)
Potassium: 3.3 mmol/L — ABNORMAL LOW (ref 3.5–5.1)
Sodium: 141 mmol/L (ref 135–145)

## 2020-05-05 LAB — GLUCOSE, CAPILLARY
Glucose-Capillary: 140 mg/dL — ABNORMAL HIGH (ref 70–99)
Glucose-Capillary: 147 mg/dL — ABNORMAL HIGH (ref 70–99)

## 2020-05-05 MED ORDER — ONDANSETRON HCL 4 MG/2ML IJ SOLN: 4.0000 mg | Freq: Four times a day (QID) | INTRAMUSCULAR | Status: DC | PRN

## 2020-05-05 MED ORDER — HALOPERIDOL 1 MG PO TABS: 0.5000 mg | ORAL_TABLET | ORAL | Status: DC | PRN

## 2020-05-05 MED ORDER — ONDANSETRON 4 MG PO TBDP: 4.0000 mg | ORAL_TABLET | Freq: Four times a day (QID) | ORAL | Status: DC | PRN

## 2020-05-05 MED ORDER — GLYCOPYRROLATE 0.2 MG/ML IJ SOLN: 0.2000 mg | INTRAMUSCULAR | Status: DC | PRN

## 2020-05-05 MED ORDER — POLYVINYL ALCOHOL 1.4 % OP SOLN: 1.0000 [drp] | Freq: Four times a day (QID) | OPHTHALMIC | Status: DC | PRN

## 2020-05-05 MED ORDER — VITAL AF 1.2 CAL PO LIQD
1000.0000 mL | ORAL | Status: DC
  Administered 2020-05-05: 1000 mL

## 2020-05-05 MED ORDER — HYDROMORPHONE HCL 1 MG/ML IJ SOLN: 1.0000 mg | INTRAMUSCULAR | Status: DC | PRN

## 2020-05-05 MED ORDER — SODIUM CHLORIDE 0.9 % IV SOLN: INTRAVENOUS | Status: DC

## 2020-05-05 MED ORDER — BIOTENE DRY MOUTH MT LIQD: 15.0000 mL | OROMUCOSAL | Status: DC | PRN

## 2020-05-05 MED ORDER — MORPHINE 100MG IN NS 100ML (1MG/ML) PREMIX INFUSION
2.0000 mg/h | INTRAVENOUS | Status: DC
  Administered 2020-05-05: 2 mg/h via INTRAVENOUS
  Filled 2020-05-05: qty 100

## 2020-05-05 MED ORDER — GLYCOPYRROLATE 0.2 MG/ML IJ SOLN
0.2000 mg | INTRAMUSCULAR | Status: DC | PRN
  Administered 2020-05-05: 0.2 mg via INTRAVENOUS
  Filled 2020-05-05: qty 1

## 2020-05-05 MED ORDER — HYDROMORPHONE HCL 1 MG/ML IJ SOLN
INTRAMUSCULAR | Status: AC
  Administered 2020-05-05: 1 mg via INTRAVENOUS
  Filled 2020-05-05: qty 1

## 2020-05-05 MED ORDER — MORPHINE SULFATE (PF) 4 MG/ML IV SOLN
4.0000 mg | INTRAVENOUS | Status: AC
  Administered 2020-05-05: 4 mg via INTRAVENOUS

## 2020-05-05 MED ORDER — POTASSIUM CHLORIDE 20 MEQ/15ML (10%) PO SOLN
20.0000 meq | ORAL | Status: AC
  Administered 2020-05-05 (×2): 20 meq
  Filled 2020-05-05 (×2): qty 15

## 2020-05-05 MED ORDER — NOREPINEPHRINE 4 MG/250ML-% IV SOLN
0.0000 ug/min | INTRAVENOUS | Status: DC
  Administered 2020-05-05: 4 ug/min via INTRAVENOUS

## 2020-05-05 MED ORDER — HALOPERIDOL LACTATE 5 MG/ML IJ SOLN: 0.5000 mg | INTRAMUSCULAR | Status: DC | PRN

## 2020-05-05 MED ORDER — GLYCOPYRROLATE 1 MG PO TABS: 1.0000 mg | ORAL_TABLET | ORAL | Status: DC | PRN

## 2020-05-05 MED ORDER — ACETAMINOPHEN 325 MG PO TABS: 650.0000 mg | ORAL_TABLET | Freq: Four times a day (QID) | ORAL | Status: DC | PRN

## 2020-05-05 MED ORDER — MORPHINE BOLUS VIA INFUSION
4.0000 mg | INTRAVENOUS | Status: DC | PRN
  Filled 2020-05-05: qty 4

## 2020-05-05 MED ORDER — POTASSIUM CHLORIDE 20 MEQ/15ML (10%) PO SOLN: 40.0000 meq | ORAL | Status: DC

## 2020-05-05 MED ORDER — ACETAMINOPHEN 650 MG RE SUPP: 650.0000 mg | Freq: Four times a day (QID) | RECTAL | Status: DC | PRN

## 2020-05-05 MED ORDER — HYDROMORPHONE HCL 1 MG/ML IJ SOLN
1.0000 mg | INTRAMUSCULAR | Status: DC | PRN
  Administered 2020-05-05: 1 mg via INTRAVENOUS
  Filled 2020-05-05: qty 1

## 2020-05-05 MED ORDER — HALOPERIDOL LACTATE 2 MG/ML PO CONC
0.5000 mg | ORAL | Status: DC | PRN
  Filled 2020-05-05: qty 0.3

## 2020-05-05 MED ORDER — LORAZEPAM 2 MG/ML IJ SOLN
2.0000 mg | INTRAMUSCULAR | Status: DC
  Administered 2020-05-05: 2 mg via INTRAVENOUS
  Filled 2020-05-05: qty 1

## 2020-05-13 ENCOUNTER — Other Ambulatory Visit: Payer: BC Managed Care – PPO

## 2020-05-16 MED FILL — Sodium Chloride IV Soln 0.9%: INTRAVENOUS | Qty: 250 | Status: AC

## 2020-05-16 MED FILL — Norepinephrine Bitartrate IV Soln 1 MG/ML (Base Equivalent): INTRAVENOUS | Qty: 4 | Status: AC

## 2020-05-25 NOTE — Progress Notes (Signed)
NAME:  Melissa Kent, MRN:  [id number], DOB:  07/02/1961, LOS: 12 ADMISSION DATE:  04/11/2020, CONSULTATION DATE:  05/16/20  REFERRING MD:  Benny Lennert, [facility] CHIEF COMPLAINT: Respiratory distress post EGD  Brief History   59 year old with EtOH cirrhosis and ascites admitted with coffee-ground emesis, drop in hemoglobin, hyponatremia and AKI, underwent EGD which showed severe esophagitis , required intubation in PACU postprocedure. Developed progressive shock post intubation/sedation  We had signed off the patient about 2 days ago Was asked to see you today secondary to respiratory distress Possible aspiration  Past Medical History  EtOH cirrhosis, ascites Leukocytosis  Significant Hospital Events   7/31 EGD showed esophagitis. Also had paracentesis removed 4 liters. Intubated in PACU d/t shock state resp  8/1: Breakthrough agitation on low-dose Precedex and intermittent fentanyl, CVP remains low 8/2: worsening shock over last 24 hrs. On levophed, vasopressin and phenylephrine. CVP-->still low. hgb 7.4 ->2 units total on 7/31 and 8/1, peaked at 9.2-->down again to 7.8-->transfusing 3rd unit for active shock  8/3: Vasoactive drip requirements improved, awake, following commands, still fairly tachypneic but chest x-ray improved off phenylephrine, still on norepinephrine as well as vasopressin, developed new atrial fibrillation with rapid ventricular response and was placed on amiodarone infusion 8/4: Clinically looks about the same.  Still on vasoactive drips.  Not able to wean much.  Developed some ST changes in anterior leads, denied chest pain, had no hemodynamic changes, no worsening work of breathing 8/5 off vasopressin. Levophed req better. ECHO w/ nml EF but gd II diastolic dysfxn. Giving lasix. Stopping fent gtt.  8/6: Off all pressors.  Amiodarone discontinued.  Off sedating drips.  Past spontaneous breathing trial, chest x-ray better following diuresis.  Successfully extubated. 8/7:  Complete continuing stress dose steroids, diuresing.  Central line removed, speech therapy consulted, transition to internal medicine Palliative care team consulted for goals of care. 8/8: Very weak, still barely able to talk louder than whisper.  Not able to take p.o.'s 8/9: Aspiration event resulting in progressive and recurrent respiratory failure and ultimately intubation 8/10 looks better. Passed SBT; extubated. Sputum w/ ecoli. Vanc and zosyn stopped. Placed on rocephin 8/11 extubated. Met with the husband and two sons. Discussed goals of care. Needing to decide on re-intubation  8/12 worse again. RR up to 40s, marked accessory use. Family agree on DNR and no intubation. Continuing supportive care w/ plan to transition to comfort if declines further  Consults:  GI  Procedures:  EGD 7/31 severe esophagitis, no varices 7/31 4 L paracentesis ETT 7/31 >>8/6 LIJ 7/31 >>out    Significant Diagnostic Tests:  CT renal stone study 7/30 >> small bilateral effusions, cirrhosis, small hiatal hernia, large ill-defined hypodense liver masses, large ascites RUQ ultrasound 7/31 >> large ascites, liver mass not well visualized MRI abdomen 7/31 >> no clear mass identified Echocardiogram 8/4>>>Left ventricular ejection fraction, by estimation, is 55 to 60%. The left ventricle has normal function. The left ventricle has no regional wall motion abnormalities. Left ventricular diastolic parameters are  consistent with Grade II diastolic dysfunction (pseudonormalization). Elevated left atrial pressure.  2. Right ventricular systolic function is normal. The right ventricular size is normal. There is normal pulmonary artery systolic pressure. 3. Left atrial size was severely dilated. 4. Right atrial size was moderately dilated.  5. The mitral valve is grossly normal. Moderate to severe mitral valve regurgitation. No evidence of mitral stenosis.  6. Tricuspid valve regurgitation is severe. 7. The aortic  valve is tricuspid. Aortic valve regurgitation is mild  to  moderate. Mild aortic valve sclerosis is present, with no evidence of aortic valve stenosis.  Micro Data:  Blood 8/1 >> Ascitic fluid 7/31 >>ng Sputum culture 8/1: Rare EIKENELLA CORRODENS typically sens to PCN) Sputum 8/9: e coli>>> Antimicrobials:  ceftx 7/31 >> 8/1 Unasyn 8/1>>>off Zosyn 8/9>>8/11 Rocephin 8/11>>> Interim history/subjective:  Looks terrible this morning.   Objective   Blood pressure 94/61, pulse 86, temperature 98.9 F (37.2 C), temperature source Axillary, resp. rate (Abnormal) 35, height _0  (1.575 m), weight 60.8 kg, SpO2 93 %.        Intake/Output Summary (Last 24 hours) at 2020-05-23 0935 Last data filed at 05/23/20 0600 Gross per 24 hour  Intake 1600 ml  Output 1525 ml  Net 75 ml   Filed Weights   04/30/20 0500 05/01/20 0438 05/02/20 0500  Weight: 66.2 kg 65 kg 60.8 kg    Examination: General: 59 year old white female currently in marked distress with nasal flare, marked abdominal accessory use and rapid respiratory rate HEENT: Nasogastric tube in place sclera nonicteric no JVD marked upper airway rhonchi, mucous membranes moist, temporal wasting Pulmonary: Diffuse scattered rhonchi marked accessory use with diaphragmatic effort and nasal flare requiring escalating oxygen currently 4 L via nasal cannula with saturations 88%.  Was on room air yesterday Portable chest x-ray personally reviewed: This showed persistent retrocardiac consolidation left.  Surprisingly no new infiltrates as of yet Cardiac rapid irregular irregular.  Bursts of atrial fibrillation with rapid ventricular response noted on telemetry Extremities warm, diffuse anasarca.  Weeping extremities particularly in the legs and forearms.  Multiple areas of ecchymosis Abdomen: Soft.  Positive bowel sounds.  Tolerating tube feeds GU: Clear yellow decreasing urine output Neuro: Awake, anxious.  Endorses pain.  Profoundly weak,  barely able to lift arms off bed.  Resolved Hospital Problem list    Liver mass -CT showed hypodense liver masses -Abdominal MRI neg  acute kidney injury Anion gap metabolic acidosis  Septic shock (resolved and off pressors as of 8/5) Hyponatremia   Assessment & Plan:   Acute hypoxic respiratory failure secondary to recurrent aspiration pneumonia -Requiring escalating oxygen -E. coli pansensitive -Weak nonproductive ineffective cough Plan Continue supplemental oxygen with pulse oximetry Placing nasal trumpet, as needed nasal tracheal suctioning Continue aggressive pulmonary hygiene Keep n.p.o. Ceftriaxone, currently antibiotic day #4 of 7 As needed Dilaudid for pain and dyspnea Lasix x1 We will add Robinul to assist with oral secretions Very low threshold to transition to complete comfort   Sepsis, septic shock secondary to recurrent aspiration pneumonia  Vasoactive drip requirements diminished Plan Antibiotics as mentioned above  Continue norepinephrine for systolic blood pressure greater than 90 for now  Continue midodrine  We will reassess this later today, may discontinue the both of these if we transition to full comfort  New atrial fibrillation with rapid ventricular response, also ST changes in anterior leads Diastolic heart failure  -Not a candidate for anticoagulation given recent life-threatening bleeding, nor is she a candidate for left heart cath -echo w/out evidence of WM abnormality. Did have grade II diastolic dysfxn but systolic fxn intact  Plan Treat respiratory failure Continue telemetry monitoring  Acute metabolic encephalopathy: Secondary to sepsis and underlying liver disease.  This looks worse today however I think it it is exacerbated by work of breathing, hypoxia, and just general failure to thrive Plan As needed low-dose lorazepam Supportive care As needed Dilaudid  Fluid and electrolyte imbalance: Anasarca, hypernatremia, hyperchloremia,   non- anion gap metabolic acidosis -Sodium improved.  Persistent non-anion gap metabolic acidosis, I suspect some of this is from bicarbonate loss with diarrhea, also ongoing hyperchloremia Plan Continue free water replacement via tube  Lasix x1  KVO fluids  A.m. chemistry   Acute kidney injury: Secondary to hypoperfusion Plan Lasix x1 A.m. chemistry  UGI bleed with acute blood loss anemia-severe esophagitis on EGD; hemoglobin essentially stable accounting for hemodilution  Plan  PPI and tube feeds  EtOH cirrhosis with ascites -Transudative, status post paracentesis -[place] eval at bedside 5/6: mod amt of ascites.  Plan Can discontinue LFT monitoring  Mild hyperglycemia Plan Blood glucose goal 140-180  Severe protein calorie malnutrition with failure to thrive in setting of what appears to be essentially end-stage liver disease Plan Continue tube feeds at 30 mL an hour  Likely transition to comfort later today     Best practice:  Diet: tubefeeds  Pain/Anxiety/Delirium protocol (if indicated): Discontinued VAP protocol (if indicated): y DVT prophylaxis: SCDs GI prophylaxis: protonix  Glucose control: SSI Mobility: BR Code Status: DO NOT RESUSCITATE  family Communication: spouse, [name] Disposition: ICU: Long discussion with the patient's 2 sons and husband.  They understand she is were sick once again.  Unfortunately she simply does not have the reserves to survive this illness.  She shared with her sons, as well as with other family members on multiple occasion that she would not want prolonged aggressive care.  Family has now unanimously agreed on DO NOT RESUSCITATE.  I suspect given her work of breathing we will need to transition to comfort sooner rather than later   Critical care time 35 minutes    Erick Colace ACNP-BC [facility] Pager # [phone] OR # [phone] if no answer

## 2020-05-25 NOTE — Plan of Care (Signed)

## 2020-05-25 NOTE — Progress Notes (Signed)
Daily Progress Note   Patient Name: Melissa Kent       Date: 05-06-2020 DOB: [birth year]  Age: 59 y.o. MRN#: [id number] Attending Physician: Marshell Garfinkel, MD Primary Care Physician: Janie Morning, DO Admit Date: 04/13/2020  Reason for Consultation/Follow-up: Establishing goals of care  Subjective: Noted patient DNR this morning per primary team.  Met with spouse Melissa Kent at bedside to discuss further [place]. Melissa Griffon, NP also present for some discussion, as well as Melissa Kent (Internal Medicine Resident).  Strong recommendations made for comfort measures.  Discussed with spouse at this point patient visibly distressed. Honestly, told Melissa Kent that we could keep Melissa Kent's body alive for long time with tube feeding, antibiotics, iv fluids, etc...but she would be in hospital, likely get sick again, possibly go to a nursing facility, probably get sick again and return to hospital- and this cycle would continue until she died- however, she would not ever return to living at home independently.  Asked Melissa Kent what [name] would say if she were present in the room as herself from years ago when they first met, and saw herself, and heard the discussion. Melissa Kent acquiesced that she would not want to continue to suffer, she would want to continue only if she was going to be able to return to better than her prior state of health before her admission.  During discussion patient had significant drop in O2 saturation, increase in RR and significant WOB despite multiple doses of hydromorphone.  At this point more honest discussion was had- patient is suffering. Current interventions are prolonging her suffering. Melissa Kent again stated if she is dying he wants her to be comfortable and not feel as though she is  dying. We shared with Melissa Kent that she is dying, and the longer we defer comfort measures, the longer she suffers and feels like she is dying. Melissa Kent agreed to full comfort measures which includes stopping pressors, fluids, discontinuing NG tube and feedings, starting continuous morphine infusion and scheduled lorazepam. Melissa Kent's main goal now is that patient dies peacefully without suffering. He shared that while he had professed to maintain hope- he actually felt this was going to be the eventual path. He called patient's sons in in order to have them return- however they had already left town and felt they had already said their goodbye's to their mom.  Review of Systems  Unable to perform ROS: Acuity of condition    Length of Stay: 12  Current Medications: Scheduled Meds:  . Chlorhexidine Gluconate Cloth  6 each Topical Daily  . LORazepam  2 mg Intravenous Q4H  . mouth rinse  15 mL Mouth Rinse BID  .  morphine injection  4 mg Intravenous NOW  . sodium chloride flush  10-40 mL Intracatheter Q12H    Continuous Infusions: . morphine 2 mg/hr (05-07-2020 1138)    PRN Meds: acetaminophen **OR** acetaminophen, antiseptic oral rinse, glycopyrrolate **OR** glycopyrrolate **OR** glycopyrrolate, haloperidol **OR** haloperidol **OR** haloperidol lactate, morphine, ondansetron **OR** ondansetron (ZOFRAN) IV, polyvinyl alcohol, sodium chloride flush  Physical Exam Vitals and nursing note reviewed.  Constitutional:      General: She is in acute distress.     Appearance: She is ill-appearing.     Comments: Cachectic, appears to be dying  Pulmonary:     Effort: Respiratory distress present.     Comments: Using all accessory muscles for breathing Musculoskeletal:     Comments: Diffuse muscle wasting, diffuse weakness  Skin:    Coloration: Skin is pale.     Comments: Grey appearance  Neurological:     Comments: Unable to move any extremities, unable to speak             Vital Signs: BP  96/61   Pulse (!) 112   Temp 98.9 F (37.2 C) (Axillary)   Resp (!) 37   Ht _0  (1.575 m)   Wt 60.8 kg   SpO2 91%   BMI 24.52 kg/m  SpO2: SpO2: 91 % O2 Device: O2 Device: Nasal Cannula O2 Flow Rate: O2 Flow Rate (L/min): 6 L/min  Intake/output summary:   Intake/Output Summary (Last 24 hours) at 2020-05-07 1139 Last data filed at May 07, 2020 0955 Gross per 24 hour  Intake 1489.97 ml  Output 1525 ml  Net -35.03 ml   LBM: Last BM Date: 05-07-2020 Baseline Weight: Weight: 52.2 kg Most recent weight: Weight: 60.8 kg       Palliative Assessment/Data: PPS: 10%      Patient Active Problem List   Diagnosis Date Noted  . Intubation of airway performed without difficulty   . Respiratory failure ([place])   . Pressure injury of skin 05/02/2020  . Advanced care planning/counseling discussion   . Goals of care, counseling/discussion   . Palliative care by specialist   . Septic shock ([place])   . Aspiration pneumonia ([place])   . Protein-calorie malnutrition, severe 04/25/2020  . Acute kidney injury ([place])   . Acute respiratory failure ([place])   . Aspiration into airway   . Upper GI bleed 04/10/2020  . History of alcohol use disorder 03/25/2020  . Ascites 03/28/2020  . Hyponatremia 03/25/2020  . Leukocytosis 04/29/2018    Palliative Care Assessment & Plan   Patient Profile: 59 y.o.femalewith past medical history of alcoholic cirrhosis with ascites, HTN, neutrophiliaadmitted on 7/31/2021with 3 days of hematemesis.She was hypotensive, had increased WBC and ascites. EGD was performed and afterward patient required intubation, and IV pressors due to shock. EGD findings showed esophagitis and gastritis. RUQ u/s performed on 7/31 showed large ascites and concern for mass, however, no mass noted on MRI of abdomen. Further workup revealed septic shock, pneumonia, with sputum culture positive for rare Eikenella Corrodens. On 8/4 had EKG changes indicating STEMI- not able to anticoagulate due  to GI bleeding. Off pressors and extubated on 8/6. Currently has large bore feeding tube in place, unable to  complete bedside swallow eval due to weakness. Palliative medicine consulted for goals of care.  Assessment/Recommendations/Plan   [name] is dying  Transition to full comfort measures only  Symptom management as ordered  Not stable for transfer out of facility- I would be surprised if she survives the night  Goals of Care and Additional Recommendations:  Limitations on Scope of Treatment: Full Comfort Care  Code Status:  DNR  Prognosis:   Hours - Days  Discharge Planning:  Anticipated Hospital Death  Care plan was discussed with spouse Melissa Kent, nursing and PCCM NP- Melissa Kent  Thank you for allowing the Palliative Medicine Team to assist in the care of this patient.   Time In: 1100 Time Out: 1200 Total Time 60 mins Prolonged Time Billed yes      Greater than 50%  of this time was spent counseling and coordinating care related to the above assessment and plan.  Mariana Kaufman, AGNP-C Palliative Medicine   Please contact Palliative Medicine Team phone at [phone] for questions and concerns.

## 2020-05-25 NOTE — TOC Transition Note (Signed)
Transition of Care [facility]) - CM/SW Discharge Note   Patient Details  Name: Tarrin Menn MRN: [id number] Date of Birth: 1961-05-10  Transition of [facility]) CM/SW Contact:  Leeroy Cha, RN Phone Number: 05-18-2020, 1:18 PM   Clinical Narrative:    Patient expired [id number]   Final next level of care: Expired Barriers to Discharge: Barriers Resolved   Patient Goals and CMS Choice Patient states their goals for this hospitalization and ongoing recovery are:: unable to state on vent CMS Medicare.gov Compare Post Acute Care list provided to:: Patient    Discharge Placement                       Discharge Plan and Services   Discharge Planning Services: CM Consult                                 Social Determinants of Health (SDOH) Interventions     Readmission Risk Interventions No flowsheet data found.

## 2020-05-25 NOTE — Progress Notes (Signed)
Pt Time of Death 1222. Husband, Tiffanyann Deroo, was at bedside. Myself, Jerene Pitch RN, and [facility] RN verified death. No lung or heart sounds were heard via ascultation. ECG rhythm on monitor was asystole.

## 2020-05-25 NOTE — Progress Notes (Signed)
eLink Physician-Brief Progress Note Patient Name: Melissa Kent DOB: 06/27/1961 MRN: [id number]   Date of Service  06-03-20  HPI/Events of Note  Nursing reports frequent runs of SVT. Now in Sinus with PAC's. Looks comfortable on video assessment. Sat = 93% and RR = 30.   eICU Interventions  Plan: 1. BMP and Mg++ level STAT.      Intervention Category Major Interventions: Arrhythmia - evaluation and management  Garreth Burnsworth [place] 06-03-2020, 12:40 AM

## 2020-05-25 NOTE — Discharge Summary (Signed)
Physician Death Summary  Patient ID: Melissa Kent MRN: [id number] DOB/AGE: 26-Mar-1961 59 y.o.  Admit date: 04/05/2020 Discharge date: 2020-06-02  Admission Diagnoses: Respiratory failure  Discharge Diagnoses:  Principal Problem:   Upper GI bleed Active Problems:   Leukocytosis   History of alcohol use disorder   Ascites   Hyponatremia   Protein-calorie malnutrition, severe   Acute kidney injury ([place])   Acute respiratory failure (H[facility])   Aspiration into airway   Aspiration pneumonia (H[facility])   Septic shock (H[facility])   Advanced care planning/counseling discussion   Goals of care, counseling/discussion   Palliative care by specialist   Pressure injury of skin   Intubation of airway performed without difficulty   Respiratory failure ([place])   Terminal care   Comfort measures only status   Discharged Condition: Deceased  Hospital Course:  59 year old with EtOH cirrhosis and ascites admitted with coffee-ground emesis, drop in hemoglobin, hyponatremia and AKI, underwent EGD which showed severe esophagitis , required intubation in PACU postprocedure. Developed progressive shock post intubation/sedation.  Hospital events as noted below.  7/31 EGD showed esophagitis. Also had paracentesis removed 4 liters. Intubated in PACU d/t shock state resp  8/1: Breakthrough agitation on low-dose Precedex and intermittent fentanyl, CVP remains low 8/2: worsening shock over last 24 hrs. On levophed, vasopressin and phenylephrine. CVP-->still low. hgb 7.4 ->2 units total on 7/31 and 8/1, peaked at 9.2-->down again to 7.8-->transfusing 3rd unit for active shock  8/3: Vasoactive drip requirements improved, awake, following commands, still fairly tachypneic but chest x-ray improved off phenylephrine, still on norepinephrine as well as vasopressin, developed new atrial fibrillation with rapid ventricular response and was placed on amiodarone infusion 8/4: Clinically looks about the same.  Still on vasoactive  drips.  Not able to wean much.  Developed some ST changes in anterior leads, denied chest pain, had no hemodynamic changes, no worsening work of breathing 8/5 off vasopressin. Levophed req better. ECHO w/ nml EF but gd II diastolic dysfxn. Giving lasix. Stopping fent gtt.  8/6: Off all pressors.  Amiodarone discontinued.  Off sedating drips.  Past spontaneous breathing trial, chest x-ray better following diuresis.  Successfully extubated. 8/7: Complete continuing stress dose steroids, diuresing.  Central line removed, speech therapy consulted, transition to internal medicine Palliative care team consulted for goals of care. 8/8: Very weak, still barely able to talk louder than whisper.  Not able to take p.o.'s 8/9: Aspiration event resulting in progressive and recurrent respiratory failure and ultimately intubation 8/10 looks better. Passed SBT; extubated. Sputum w/ ecoli. Vanc and zosyn stopped. Placed on rocephin 8/11 extubated. Met with the husband and two sons. Discussed goals of care. Needing to decide on re-intubation  8/12 worse again. RR up to 40s, marked accessory use. Family agree on DNR and no intubation. Family with palliative care and decision made to transition to comfort measures   Marshell Garfinkel MD [facility] Please see [email or web address] for pager details.  05/07/2020, 11:12 AM

## 2020-05-25 NOTE — Progress Notes (Signed)
   04-Jun-2020 1200  Clinical Encounter Type  Visited With Patient and family together  Visit Type Initial;Psychological support;Spiritual support;Critical Care;Patient actively dying  Referral From Nurse  Consult/Referral To Chaplain  Spiritual Encounters  Spiritual Needs Emotional;Other (Comment);Grief support (Spiritual Care Conversation/.Support)  Stress Factors  Patient Stress Factors Not reviewed  Family Stress Factors Health changes;Loss;Major life changes   I visited with Melissa Kent and her husband. Kiandra was not awake at the time of my visit. I provided grief support for her husband and a prayer shawl.   Please, contact Spiritual Care for further assistance.   Chaplain Shanon Ace M.Div., [facility]

## 2020-05-25 DEATH — deceased

## 2022-04-06 IMAGING — CT CT ABD-PELV W/ CM
2 of 5 series · 15 of 46 positions shown, 17 images · IV contrast (APPLIED)
Comparison: None.

CLINICAL DATA: Nausea, vomiting and diarrhea for 2 weeks.

EXAM:
CT ABDOMEN AND PELVIS WITH CONTRAST
TECHNIQUE: Multidetector CT imaging of the abdomen and pelvis was performed
using the standard protocol following bolus administration of
intravenous contrast.
CONTRAST:  100mL OMNIPAQUE IOHEXOL 300 MG/ML  SOLN

[Series 3: abdomen 5.0 · axial · 0.69mm/px · z∈[+676,+1086]mm · 12 of 96 slices shown, 14 images]
[im 7/96  soft-tissue]
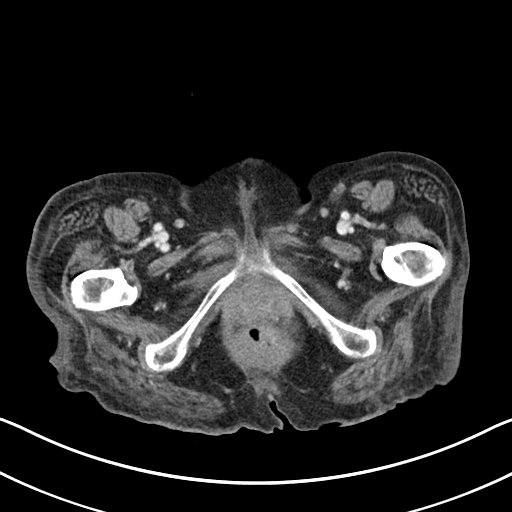
[im 7/96  bone]
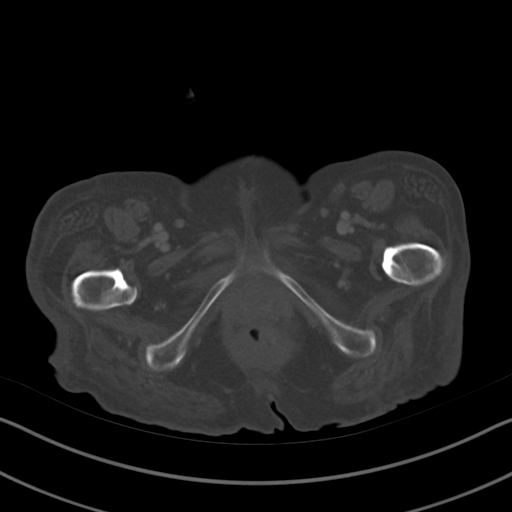
[im 14/96  soft-tissue]
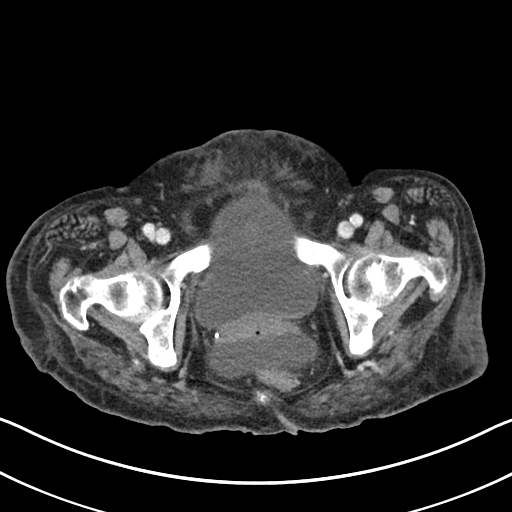
[im 21/96  soft-tissue]
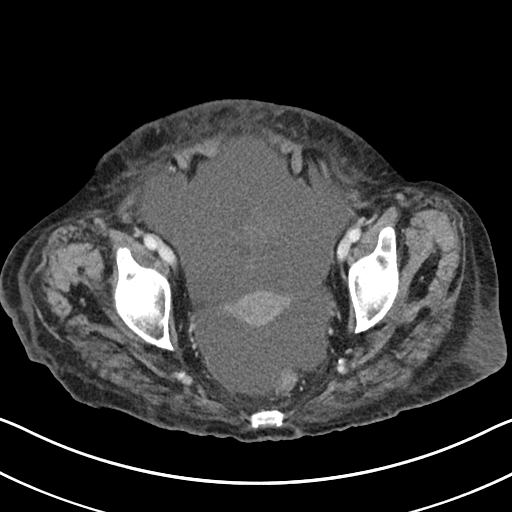
[im 28/96  soft-tissue]
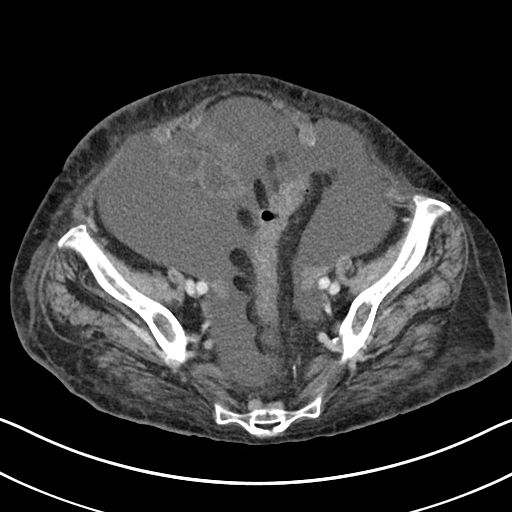
[im 34/96  soft-tissue]
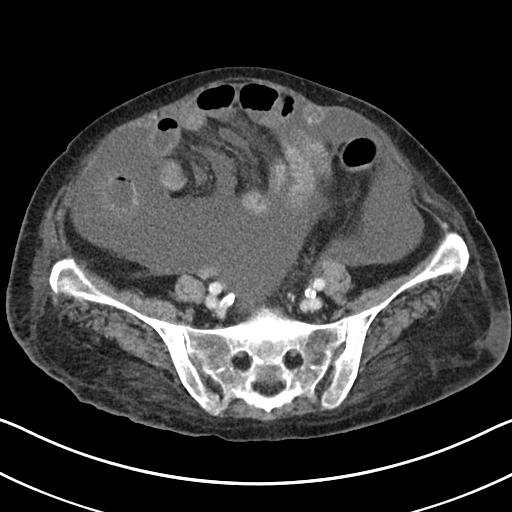
[im 41/96  soft-tissue]
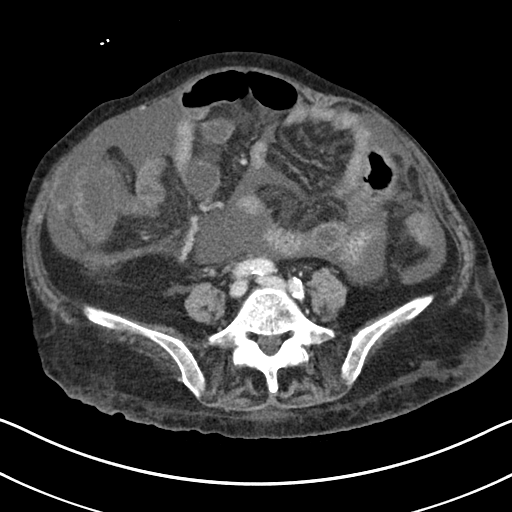
[im 55/96  soft-tissue]
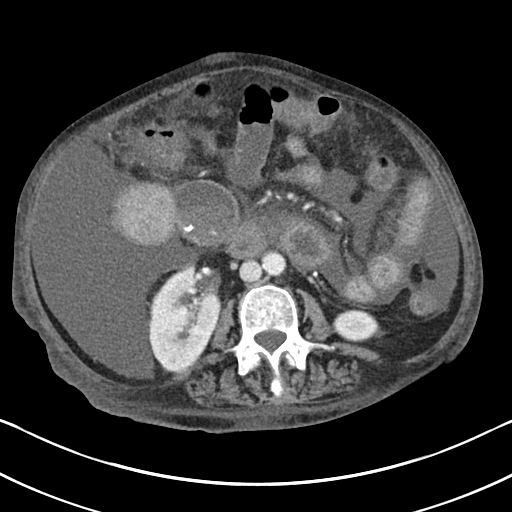
[im 62/96  soft-tissue]
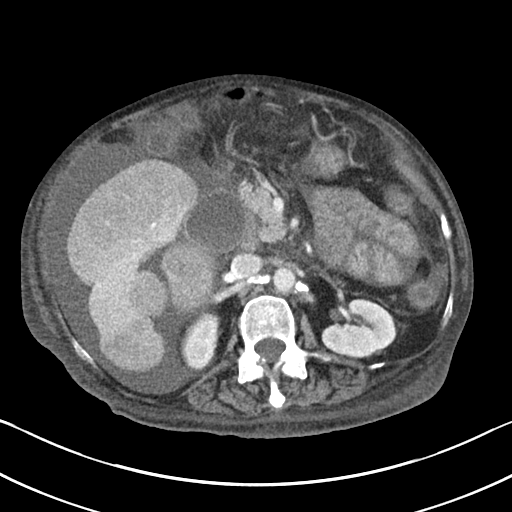
[im 68/96  soft-tissue]
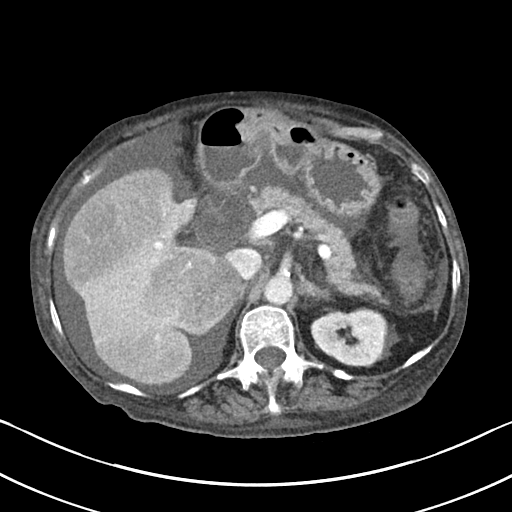
[im 68/96  bone]
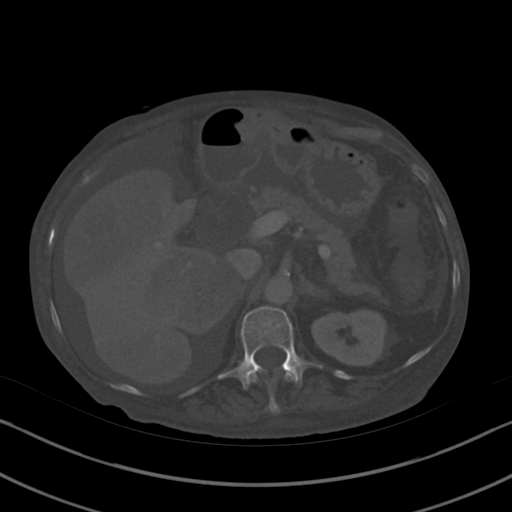
[im 75/96  soft-tissue]
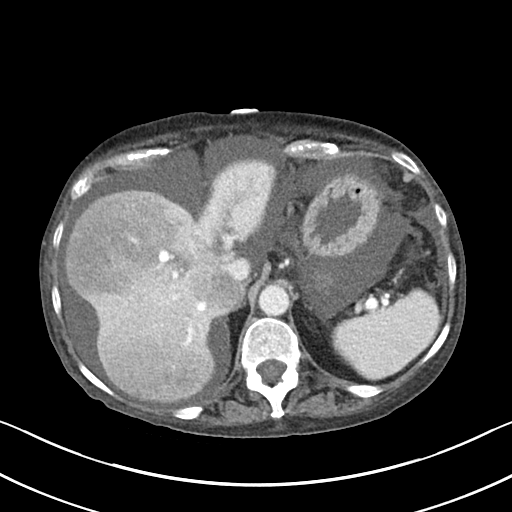
[im 82/96  soft-tissue]
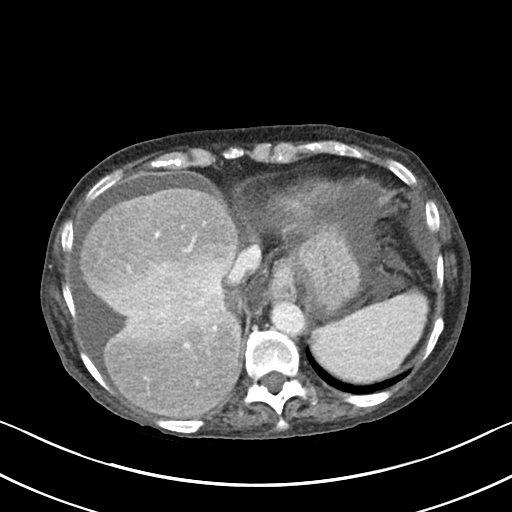
[im 89/96  soft-tissue]
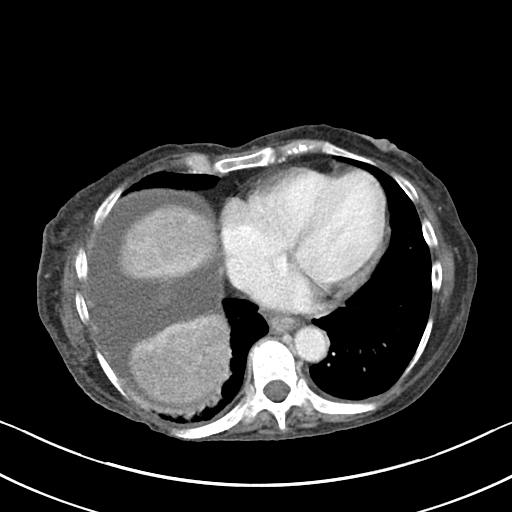

[Series 6: abdomen 3.0 mpr cor · coronal · 0.74mm/px · 3 of 90 slices shown]
[im 30/90  soft-tissue]
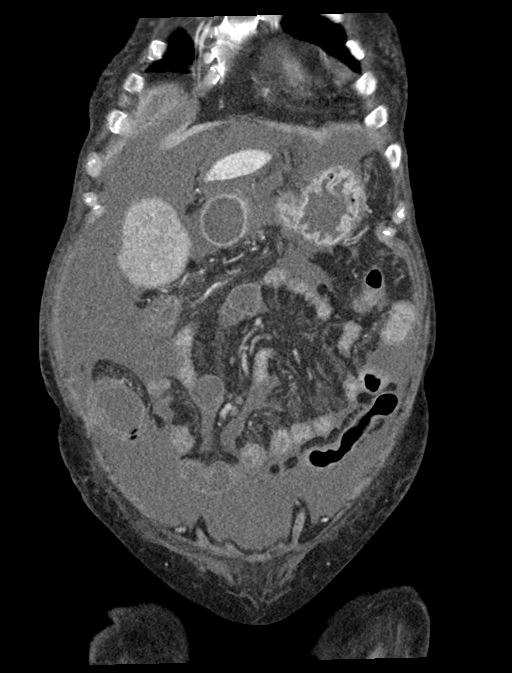
[im 40/90  soft-tissue]
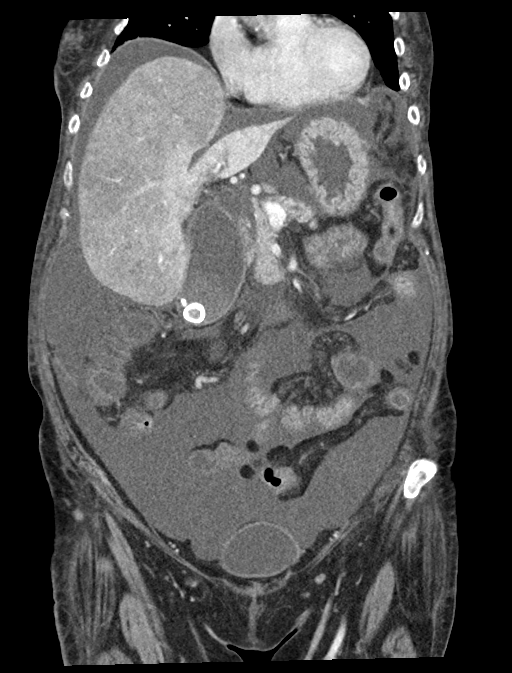
[im 50/90  soft-tissue]
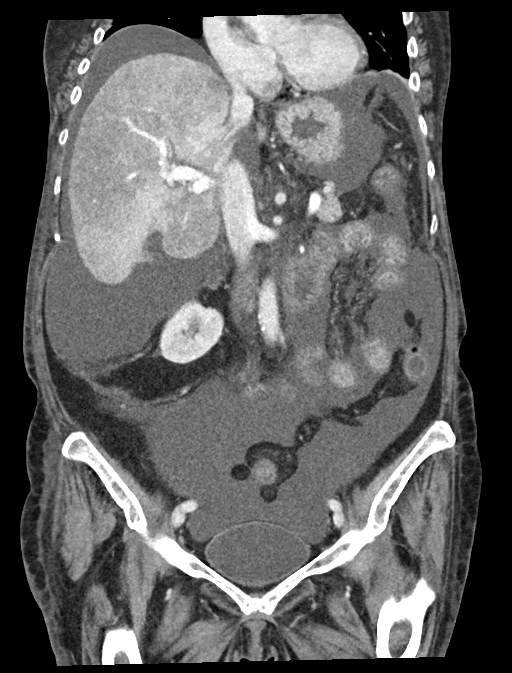

[15 of 46 positions shown; findings below may reference images not displayed]

FINDINGS: Lower chest: Mild linear opacities at the right lung base consistent
with atelectasis. No acute findings. No lung base masses or nodules.

Hepatobiliary: Poorly defined hypoattenuating liver masses involving
most of the liver. Throughout most of the anterior right lobe, and
involving the medial segment of the left lobe, a hypoattenuating
mass spans approximately 17 x 6 x 10 cm. Another poorly defined mass
involves most of the posterior aspect of the right liver lobe
measuring approximately 16 x 6 x 9 cm. There is a mass centered on
the caudate lobe measuring approximately 8 x 5 x 6 cm. The lateral
segment of the left lobe a small heterogeneous.

Gallbladder is distended. There is gallbladder sludge and calcified
stones. No wall thickening or evidence of acute cholecystitis. No
bile duct dilation.

Pancreas: Unremarkable. No pancreatic ductal dilatation or
surrounding inflammatory changes.

Spleen: Normal in size without focal abnormality.

Adrenals/Urinary Tract: No adrenal masses. Right kidney mildly
displaced inferiorly by the liver. Kidneys normal in overall size.
Mild diffuse renal cortical thinning. Symmetric renal enhancement
and excretion. No renal masses, stones or hydronephrosis. Ureters
normal in course and in caliber. Bladder is unremarkable.

Stomach/Bowel: Stomach is unremarkable. Small bowel and colon are
normal in caliber. No wall thickening. No convincing inflammation.
Possible small polyp, 7 mm in size, projects within the left colon
at the splenic flexure. Appendix not definitively seen, but no
convincing appendicitis.

Vascular/Lymphatic: Aortic atherosclerosis. No aneurysm. No enlarged
lymph nodes.

Reproductive: Uterus and bilateral adnexa are unremarkable.

Other: Moderate amount of ascites. Small amount of fluid enters a
small anterior midline hernia.

Musculoskeletal: Mild compression fracture of T9, which appears
benign and most likely chronic. No other fractures. No osteoblastic
or osteolytic lesions.
IMPRESSION: 1. Multiple ill-defined hypoattenuating liver masses involving most
of the liver. This is highly suspicious for neoplastic disease.
Suspect multifocal hepatocellular carcinoma, with metastatic disease
also possible. There are no other findings on the CT to suggest a
source of metastatic disease. There is a possible 7 mm polyp in the
splenic flexure of the colon, which would not account for the liver
findings. Consider follow-up liver MRI without and with contrast for
further assessment and characterization.
2. Moderate ascites.  No convincing peritoneal carcinomatosis.
3. Gallstones and gallbladder sludge.  No acute cholecystitis.
4. No CT evidence of pyelonephritis.  No hydronephrosis.
5. Aortic atherosclerosis.
